# Patient Record
Sex: Female | Born: 1995 | Race: White | Hispanic: No | Marital: Single
Health system: Southern US, Community
[De-identification: ages and names within clinical notes are randomized; demographics above are authoritative.]

## PROBLEM LIST (undated history)

## (undated) DIAGNOSIS — F329 Major depressive disorder, single episode, unspecified: Secondary | ICD-10-CM

## (undated) DIAGNOSIS — K219 Gastro-esophageal reflux disease without esophagitis: Secondary | ICD-10-CM

## (undated) DIAGNOSIS — M5126 Other intervertebral disc displacement, lumbar region: Secondary | ICD-10-CM

## (undated) DIAGNOSIS — J45909 Unspecified asthma, uncomplicated: Secondary | ICD-10-CM

## (undated) DIAGNOSIS — F32A Depression, unspecified: Secondary | ICD-10-CM

## (undated) HISTORY — PX: ADENOIDECTOMY: SUR15

## (undated) HISTORY — PX: TONSILLECTOMY: SUR1361

## (undated) HISTORY — PX: TUMOR EXCISION: SHX421

## (undated) HISTORY — DX: Gastro-esophageal reflux disease without esophagitis: K21.9

---
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
## 2015-03-07 NOTE — Therapy (Signed)
[facility] [place], [place], [address] Phone: [phone]   Fax:  [phone]  Physical Therapy Treatment  Patient Details  Name: Melanie Mcdaniel MRN: [id number] Date of Birth: 09/21/1995 No Data Recorded  Encounter Date: 11/27/2014    Past Medical History  Diagnosis Date  . GERD (gastroesophageal reflux disease)   . Lumbar herniated disc   . Depression   . Asthma     Past Surgical History  Procedure Laterality Date  . Tonsillectomy    . 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 Adenoidectomy    . Lumbar laminectomy Right 04/23/2014    Procedure: Right L5-S1 Microdiscectomy;  Surgeon: Marybelle Killings, MD;  Location: [facility];  Service: Orthopedics;  Laterality: Right;    There were no vitals filed for this visit.  Visit Diagnosis:  Right low back pain, with sciatica presence unspecified  Lower extremity pain, right  Debility                                    PT Long Term Goals - 11/27/14 1639    PT LONG TERM GOAL #1   Title Ind with HEP.   Time 6   Period Weeks   Status Achieved   PT LONG TERM GOAL #2   Title Sit 30 minutes with pain not > 3/10.   Time 6   Period Weeks   Status Not Met   PT LONG TERM GOAL #3   Title Stand 30 minutes with pain not > 3/10.   Baseline Pain rises to nearly 10/10 after prolonged sitting.   Time 6   Period Weeks   Status Not Met   PT LONG TERM GOAL #4   Title Eliminate right LE pain.   Baseline Patient has (at times) intense right LE pain.   Period Weeks   Status Not Met               Problem List Patient Active Problem List   Diagnosis Date Noted  . S/P lumbar discectomy 04/23/2014   PHYSICAL THERAPY DISCHARGE SUMMARY  Visits from Start of Care: 8  Current functional level related to goals / functional outcomes: Please see above.   Remaining deficits: Continued low back pain.   Education / Equipment: HEP.   Plan: Patient agrees to discharge.  Patient goals were not met. Patient is being discharged due to lack of progress.  ?????      Kyel Purk, [place] MPT 03/07/2015, 2:59 PM  [facility] [address] [place], [place], [address] Phone: [phone]   Fax:  [phone]  Name: Melanie Mcdaniel MRN: [id number] Date of Birth: 11-Jun-1995

## 2015-05-04 IMAGING — CR DG CHEST 2V
2 series · 2 of 2 positions shown · non-contrast
Comparison: None.

CLINICAL DATA: Preoperative evaluation for lumbar surgery

EXAM:
CHEST  2 VIEW

[w chest pa]
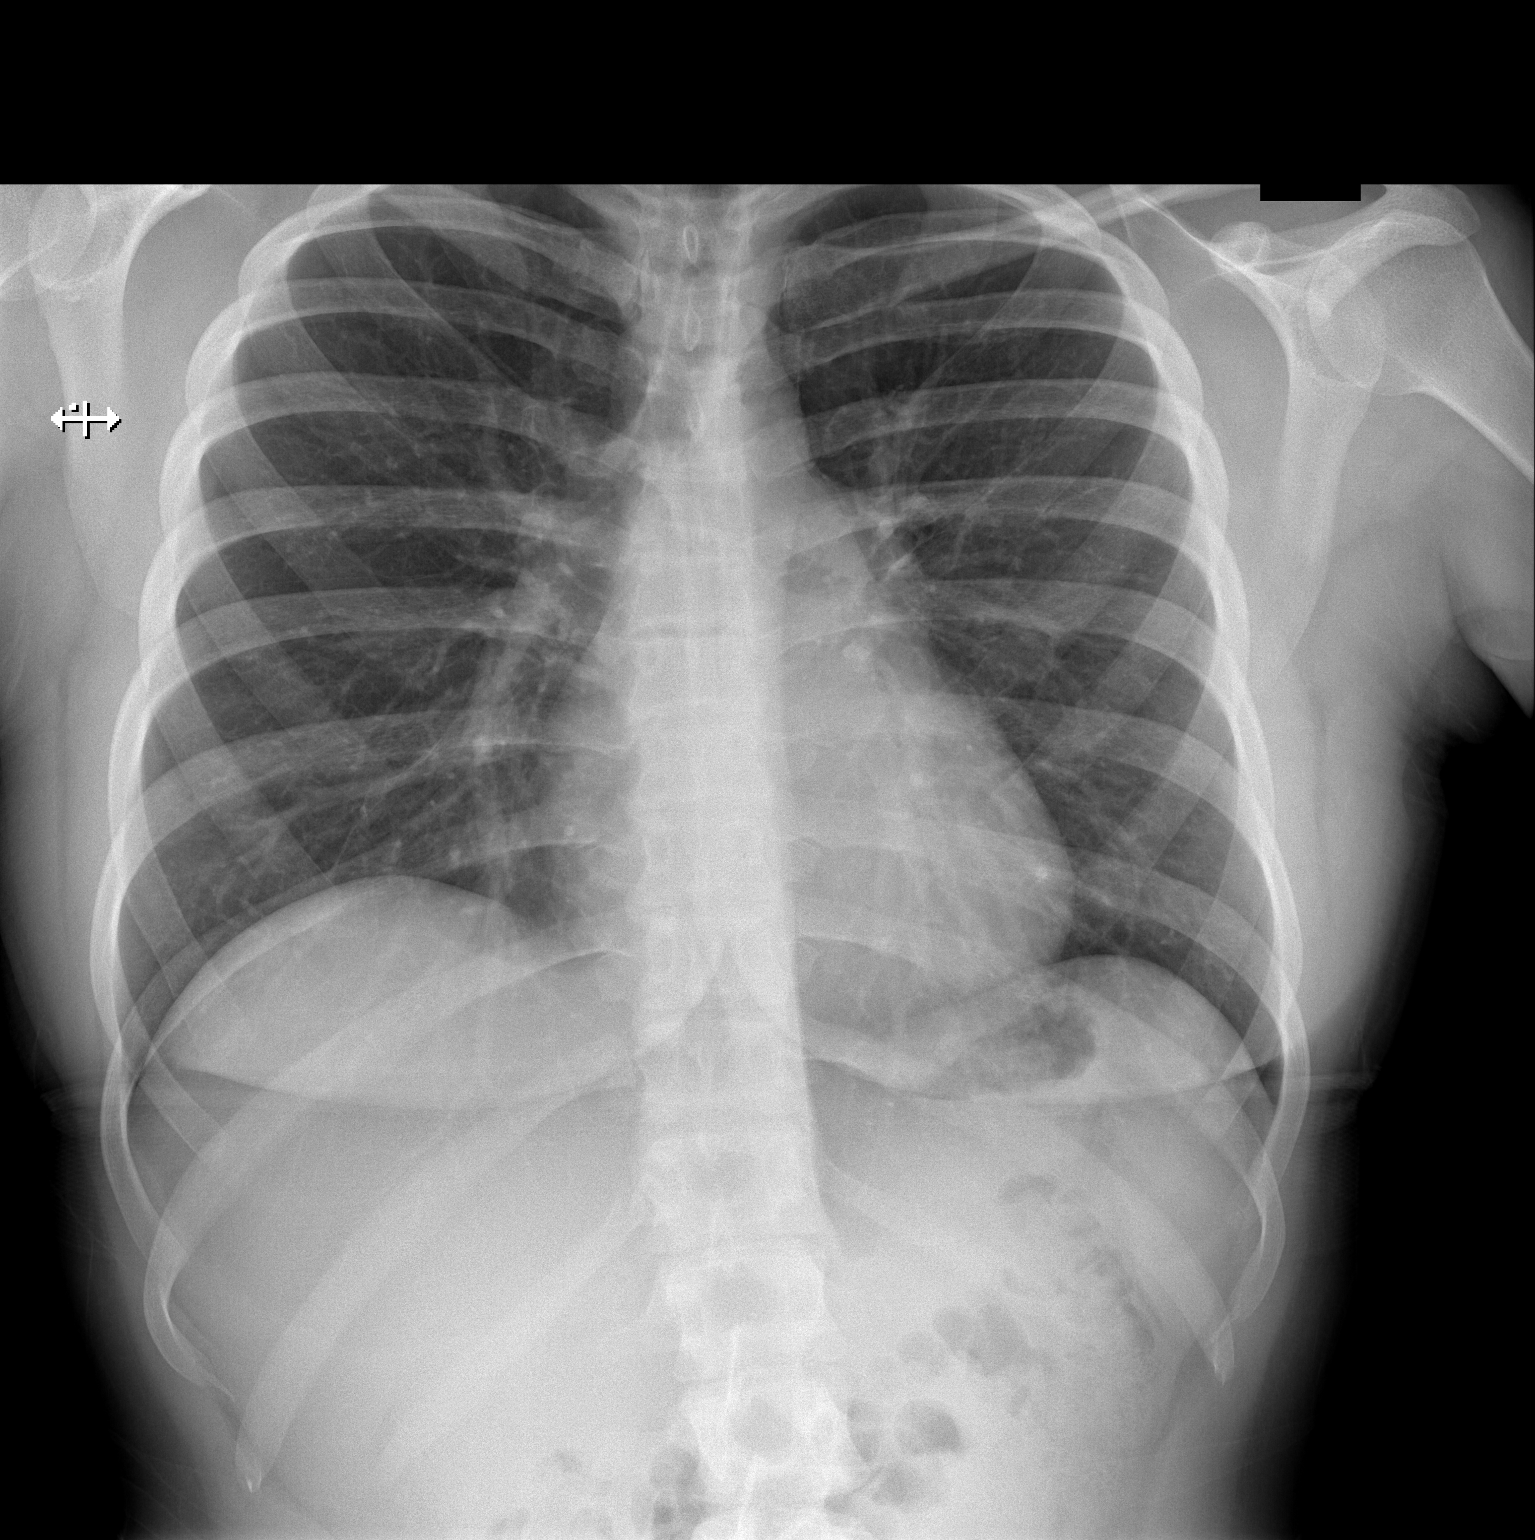

[w chest lat]
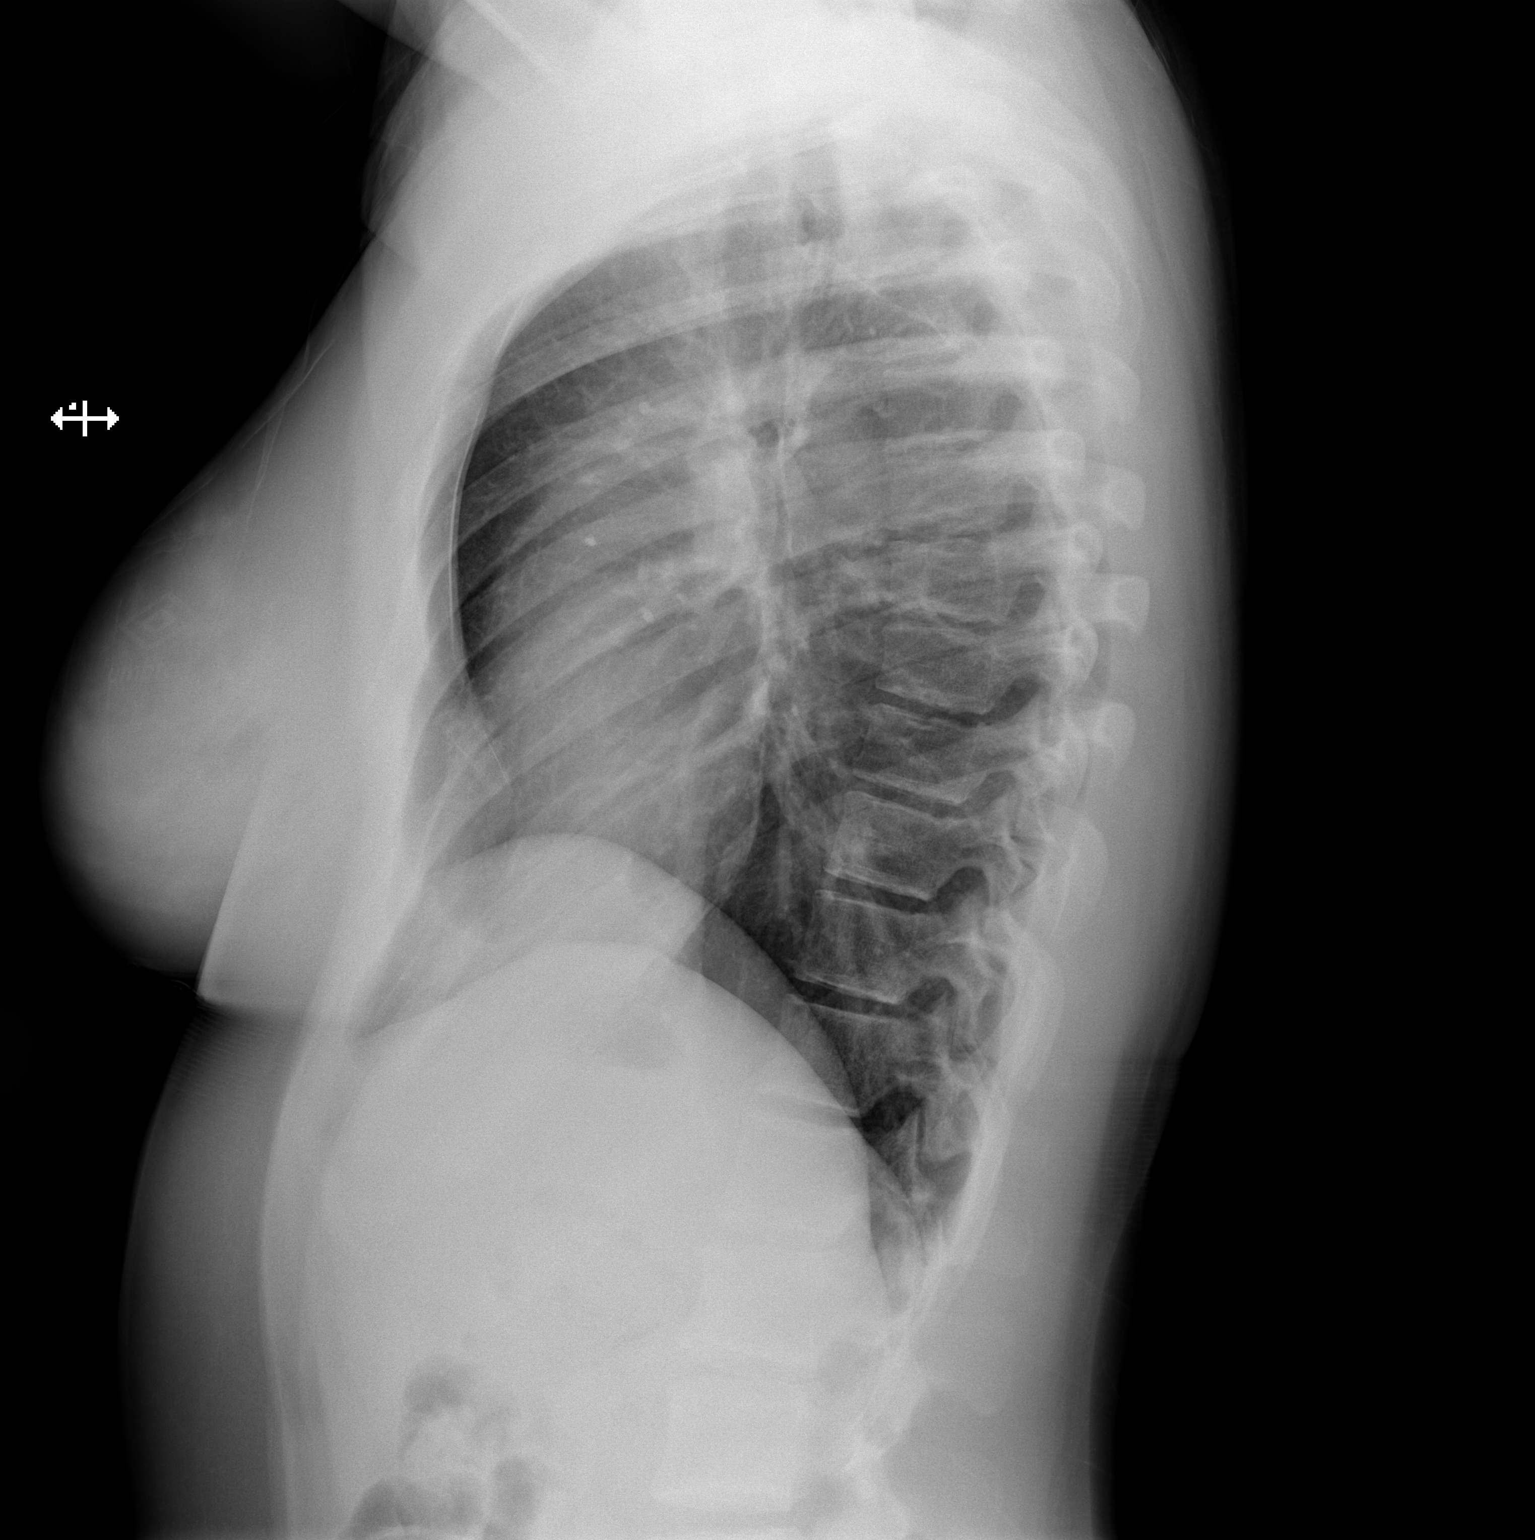

[2 of 2 positions shown; findings below may reference images not displayed]

FINDINGS: The heart size and mediastinal contours are within normal limits.
Both lungs are clear. The visualized skeletal structures are
unremarkable.
IMPRESSION: No active cardiopulmonary disease.

## 2016-01-23 ENCOUNTER — Ambulatory Visit (INDEPENDENT_AMBULATORY_CARE_PROVIDER_SITE_OTHER): Payer: Medicaid Other | Admitting: Orthopaedic Surgery

## 2016-03-16 ENCOUNTER — Other Ambulatory Visit: Payer: Self-pay | Admitting: Physician Assistant

## 2016-03-16 MED ORDER — ALPRAZOLAM 1 MG PO TABS: 1.0000 mg | ORAL_TABLET | Freq: Two times a day (BID) | ORAL | 0 refills | Status: DC | PRN

## 2016-03-16 NOTE — Progress Notes (Signed)
Rx called in to pharmacy. 

## 2016-03-24 ENCOUNTER — Other Ambulatory Visit: Payer: Self-pay | Admitting: *Deleted

## 2016-05-01 ENCOUNTER — Other Ambulatory Visit: Payer: Self-pay | Admitting: Physician Assistant

## 2016-06-09 ENCOUNTER — Telehealth (INDEPENDENT_AMBULATORY_CARE_PROVIDER_SITE_OTHER): Payer: Self-pay | Admitting: Orthopaedic Surgery

## 2016-06-09 NOTE — Telephone Encounter (Signed)
Patient called asked if Dr Ophelia CharterYates would write her a note for missing work on Saturday afternoon. Patient said her back is really hurting her. The number to contact patient is [phone]

## 2016-06-09 NOTE — Telephone Encounter (Signed)
Note entered and at front for pick up. I left voicemail for patient advising. Also asked her to call and make return appt if she continues to have pain.

## 2016-06-09 NOTE — Telephone Encounter (Signed)
Ok to do ,  ROV if  Keeps bothering her. thx

## 2016-06-09 NOTE — Telephone Encounter (Signed)
Please advise 

## 2016-06-12 ENCOUNTER — Telehealth (INDEPENDENT_AMBULATORY_CARE_PROVIDER_SITE_OTHER): Payer: Self-pay | Admitting: Orthopaedic Surgery

## 2016-06-12 NOTE — Telephone Encounter (Signed)
Patient called asked if her work note can be faxed [facility] to [facility] for her to pick up after her appointment on 06/18/16. The number to contact patient is [phone]

## 2016-06-12 NOTE — Telephone Encounter (Signed)
Will print work note in [place] [facility] [facility] when there for appt

## 2016-06-18 ENCOUNTER — Ambulatory Visit (INDEPENDENT_AMBULATORY_CARE_PROVIDER_SITE_OTHER): Payer: Self-pay | Admitting: Orthopaedic Surgery

## 2017-02-19 ENCOUNTER — Encounter: Payer: Self-pay | Admitting: Physician Assistant

## 2017-02-19 ENCOUNTER — Ambulatory Visit: Payer: BLUE CROSS/BLUE SHIELD | Admitting: Physician Assistant

## 2017-02-19 VITALS — BP 122/85 | HR 66 | Temp 97.3°F | Ht 67.5 in | Wt 177.0 lb

## 2017-02-19 DIAGNOSIS — L659 Nonscarring hair loss, unspecified: Secondary | ICD-10-CM

## 2017-02-19 DIAGNOSIS — Z01419 Encounter for gynecological examination (general) (routine) without abnormal findings: Secondary | ICD-10-CM | POA: Insufficient documentation

## 2017-02-19 DIAGNOSIS — N946 Dysmenorrhea, unspecified: Secondary | ICD-10-CM

## 2017-02-19 DIAGNOSIS — R635 Abnormal weight gain: Secondary | ICD-10-CM | POA: Diagnosis not present

## 2017-02-19 DIAGNOSIS — M5136 Other intervertebral disc degeneration, lumbar region: Secondary | ICD-10-CM | POA: Diagnosis not present

## 2017-02-19 DIAGNOSIS — Z309 Encounter for contraceptive management, unspecified: Secondary | ICD-10-CM

## 2017-02-19 MED ORDER — NORGESTIMATE-ETH ESTRADIOL 0.25-35 MG-MCG PO TABS: 1.0000 | ORAL_TABLET | Freq: Every day | ORAL | 11 refills | Status: DC

## 2017-02-19 MED ORDER — DICLOFENAC SODIUM 75 MG PO TBEC: 75.0000 mg | DELAYED_RELEASE_TABLET | Freq: Two times a day (BID) | ORAL | 5 refills | Status: DC

## 2017-02-19 NOTE — Progress Notes (Signed)
BP 122/85   Pulse 66   Temp (!) 97.3 F (36.3 C) (Oral)   Ht 5' 7.5" (1.715 m)   Wt 177 lb (80.3 kg)   BMI 27.31 kg/m    Subjective:    Patient ID: Melanie Mcdaniel, female    DOB: November 03, 1995, 22 y.o.   MRN: [id number]  HPI: Melanie Mcdaniel is a 22 y.o. female presenting on 02/19/2017 for New Patient (Initial Visit) and Establish Care  Patient comes in to be established as a new patient.  In the past she has had a herniated disc that has had a surgery.  He continues with chronic pain associated with degenerative disc disease.  He is also having very painful and heavy periods.  At age 69 she started taking birth control pill because of her heavy cycles.  Due to insurance she has not been on medication for a couple years now.  Would like to get back on it.  She is sexually active at this time.  She does use protection.  She has been with her boyfriend for a year now.  She reports also change in her hair.  She said a lot has fallen off and broken out.  She has a hard time growing about.  She is also had a change in her weight.  Relevant past medical, surgical, family and social history reviewed and updated as indicated. Allergies and medications reviewed and updated.  Past Medical History:  Diagnosis Date  . Asthma   . Depression   . GERD (gastroesophageal reflux disease)   . Lumbar herniated disc     Past Surgical History:  Procedure Laterality Date  . ADENOIDECTOMY    . LUMBAR LAMINECTOMY Right 04/23/2014   Procedure: Right L5-S1 Microdiscectomy;  Surgeon: Marybelle Killings, MD;  Location: [facility];  Service: Orthopedics;  Laterality: Right;  . TONSILLECTOMY    . TUMOR EXCISION Right age 6   around carotid artery    Review of Systems  Constitutional: Negative.  Negative for activity change, fatigue and fever.  HENT: Negative.   Eyes: Negative.   Respiratory: Negative.  Negative for cough.   Cardiovascular: Negative.  Negative for chest pain.  Gastrointestinal: Negative.  Negative for  abdominal pain.  Endocrine: Negative.   Genitourinary: Positive for menstrual problem and pelvic pain. Negative for dysuria.  Musculoskeletal: Positive for back pain and myalgias. Negative for gait problem.  Skin: Negative.   Neurological: Negative.     Allergies as of 02/19/2017      Reactions   Tamiflu [oseltamivir Phosphate] Anaphylaxis   Swelling of throat, hands and itching, couldn't breathe      Medication List        Accurate as of 02/19/17  1:29 PM. Always use your most recent med list.          diclofenac 75 MG EC tablet Commonly known as:  VOLTAREN Take 1 tablet (75 mg total) by mouth 2 (two) times daily.   norgestimate-ethinyl estradiol 0.25-35 MG-MCG tablet Commonly known as:  ORTHO-CYCLEN,SPRINTEC,PREVIFEM Take 1 tablet by mouth daily.          Objective:    BP 122/85   Pulse 66   Temp (!) 97.3 F (36.3 C) (Oral)   Ht 5' 7.5" (1.715 m)   Wt 177 lb (80.3 kg)   BMI 27.31 kg/m   Allergies  Allergen Reactions  . Tamiflu [Oseltamivir Phosphate] Anaphylaxis    Swelling of throat, hands and itching, couldn't breathe    Physical Exam  Constitutional: She is oriented to person, place, and time. She appears well-developed and well-nourished.  HENT:  Head: Normocephalic and atraumatic.  Right Ear: Tympanic membrane, external ear and ear canal normal.  Left Ear: Tympanic membrane, external ear and ear canal normal.  Nose: Nose normal. No rhinorrhea.  Mouth/Throat: Oropharynx is clear and moist and mucous membranes are normal. No oropharyngeal exudate or posterior oropharyngeal erythema.  Eyes: Conjunctivae and EOM are normal. Pupils are equal, round, and reactive to light.  Neck: Normal range of motion. Neck supple.  Cardiovascular: Normal rate, regular rhythm, normal heart sounds and intact distal pulses.  Pulmonary/Chest: Effort normal and breath sounds normal.  Abdominal: Soft. Bowel sounds are normal.  Neurological: She is alert and oriented to  person, place, and time. She has normal reflexes.  Skin: Skin is warm and dry. No rash noted.  Psychiatric: She has a normal mood and affect. Her behavior is normal. Judgment and thought content normal.  Nursing note and vitals reviewed.   Results for orders placed or performed during the hospital encounter of 04/13/14  Surgical pcr screen  Result Value Ref Range   MRSA, PCR NEGATIVE NEGATIVE   Staphylococcus aureus NEGATIVE NEGATIVE  hCG, serum, qualitative  Result Value Ref Range   Preg, Serum NEGATIVE NEGATIVE  CBC  Result Value Ref Range   WBC 6.8 4.0 - 10.5 K/uL   RBC 4.67 3.87 - 5.11 MIL/uL   Hemoglobin 13.2 12.0 - 15.0 g/dL   HCT 39.6 36.0 - 46.0 %   MCV 84.8 78.0 - 100.0 fL   MCH 28.3 26.0 - 34.0 pg   MCHC 33.3 30.0 - 36.0 g/dL   RDW 13.9 11.5 - 15.5 %   Platelets 248 150 - 400 K/uL  Comprehensive metabolic panel  Result Value Ref Range   Sodium 138 135 - 145 mmol/L   Potassium 3.9 3.5 - 5.1 mmol/L   Chloride 103 96 - 112 mmol/L   CO2 28 19 - 32 mmol/L   Glucose, Bld 87 70 - 99 mg/dL   BUN 8 6 - 23 mg/dL   Creatinine, Ser 0.88 0.50 - 1.10 mg/dL   Calcium 9.6 8.4 - 10.5 mg/dL   Total Protein 7.3 6.0 - 8.3 g/dL   Albumin 4.0 3.5 - 5.2 g/dL   AST 22 0 - 37 U/L   ALT 15 0 - 35 U/L   Alkaline Phosphatase 54 39 - 117 U/L   Total Bilirubin 0.6 0.3 - 1.2 mg/dL   GFR calc non Af Amer >90 >90 mL/min   GFR calc Af Amer >90 >90 mL/min   Anion gap 7 5 - 15      Assessment & Plan:   1. DDD (degenerative disc disease), lumbar - diclofenac (VOLTAREN) 75 MG EC tablet; Take 1 tablet (75 mg total) by mouth 2 (two) times daily.  Dispense: 60 tablet; Refill: 5  2. Dysmenorrhea  3. Encounter for contraceptive management, unspecified type - norgestimate-ethinyl estradiol (ORTHO-CYCLEN,SPRINTEC,PREVIFEM) 0.25-35 MG-MCG tablet; Take 1 tablet by mouth daily.  Dispense: 1 Package; Refill: 11  4. Weight gain - CBC with Differential/Platelet - CMP14+EGFR - Thyroid Panel With  TSH  5. Hair loss - CBC with Differential/Platelet - CMP14+EGFR - Thyroid Panel With TSH    Current Outpatient Medications:  .  diclofenac (VOLTAREN) 75 MG EC tablet, Take 1 tablet (75 mg total) by mouth 2 (two) times daily., Disp: 60 tablet, Rfl: 5 .  norgestimate-ethinyl estradiol (ORTHO-CYCLEN,SPRINTEC,PREVIFEM) 0.25-35 MG-MCG tablet, Take 1 tablet by mouth daily.,  Disp: 1 Package, Rfl: 11 Continue all other maintenance medications as listed above.  Follow up plan: Return in about 6 months (around 08/19/2017).  Educational handout given for Wheelwright PA-C [facility] [address]  [place], [place] [address] [phone]   02/19/2017, 1:29 PM

## 2017-02-19 NOTE — Patient Instructions (Signed)
In a few days you may receive a survey in the mail or online from [employer] regarding your visit with us today. Please take a moment to fill this out. Your feedback is very important to our whole office. It can help us better understand your needs as well as improve your experience and satisfaction. Thank you for taking your time to complete it. We care about you.  Simi Briel, PA-C  

## 2017-02-20 LAB — CMP14+EGFR
A/G RATIO: 2 (ref 1.2–2.2)
ALT: 23 IU/L (ref 0–32)
AST: 25 IU/L (ref 0–40)
Albumin: 4.5 g/dL (ref 3.5–5.5)
Alkaline Phosphatase: 77 IU/L (ref 39–117)
BUN / CREAT RATIO: 14 (ref 9–23)
BUN: 10 mg/dL (ref 6–20)
Bilirubin Total: <0.2 mg/dL (ref 0.0–1.2)
CALCIUM: 9.5 mg/dL (ref 8.7–10.2)
CO2: 22 mmol/L (ref 20–29)
Chloride: 102 mmol/L (ref 96–106)
Creatinine, Ser: 0.69 mg/dL (ref 0.57–1.00)
GFR, EST AFRICAN AMERICAN: 144 mL/min/{1.73_m2} (ref >59)
GFR, EST NON AFRICAN AMERICAN: 125 mL/min/{1.73_m2} (ref >59)
Globulin, Total: 2.3 g/dL (ref 1.5–4.5)
Glucose: 91 mg/dL (ref 65–99)
Potassium: 4.4 mmol/L (ref 3.5–5.2)
Sodium: 139 mmol/L (ref 134–144)
TOTAL PROTEIN: 6.8 g/dL (ref 6.0–8.5)

## 2017-02-20 LAB — THYROID PANEL WITH TSH
Free Thyroxine Index: 1.3 (ref 1.2–4.9)
T3 Uptake Ratio: 26 % (ref 24–39)
T4, Total: 5.1 ug/dL (ref 4.5–12.0)
TSH: 1.42 u[IU]/mL (ref 0.450–4.500)

## 2017-02-20 LAB — CBC WITH DIFFERENTIAL/PLATELET
Basophils Absolute: 0 10*3/uL (ref 0.0–0.2)
Basos: 0 %
EOS (ABSOLUTE): 0.1 10*3/uL (ref 0.0–0.4)
Eos: 1 %
HEMATOCRIT: 42 % (ref 34.0–46.6)
Hemoglobin: 14 g/dL (ref 11.1–15.9)
IMMATURE GRANS (ABS): 0 10*3/uL (ref 0.0–0.1)
IMMATURE GRANULOCYTES: 0 %
Lymphocytes Absolute: 1.6 10*3/uL (ref 0.7–3.1)
Lymphs: 26 %
MCH: 29.9 pg (ref 26.6–33.0)
MCHC: 33.3 g/dL (ref 31.5–35.7)
MCV: 90 fL (ref 79–97)
MONOS ABS: 0.4 10*3/uL (ref 0.1–0.9)
Monocytes: 6 %
NEUTROS ABS: 4.2 10*3/uL (ref 1.4–7.0)
NEUTROS PCT: 67 %
Platelets: 218 10*3/uL (ref 150–379)
RBC: 4.69 x10E6/uL (ref 3.77–5.28)
RDW: 15.6 % — AB (ref 12.3–15.4)
WBC: 6.3 10*3/uL (ref 3.4–10.8)

## 2017-06-23 ENCOUNTER — Encounter: Payer: Self-pay | Admitting: Physician Assistant

## 2017-06-23 ENCOUNTER — Ambulatory Visit: Payer: BLUE CROSS/BLUE SHIELD | Admitting: Physician Assistant

## 2017-06-23 VITALS — BP 130/94 | HR 77 | Temp 98.2°F | Ht 67.5 in | Wt 167.8 lb

## 2017-06-23 DIAGNOSIS — M5431 Sciatica, right side: Secondary | ICD-10-CM | POA: Diagnosis not present

## 2017-06-23 DIAGNOSIS — M5136 Other intervertebral disc degeneration, lumbar region: Secondary | ICD-10-CM

## 2017-06-23 DIAGNOSIS — Z3041 Encounter for surveillance of contraceptive pills: Secondary | ICD-10-CM | POA: Diagnosis not present

## 2017-06-23 MED ORDER — GABAPENTIN 400 MG PO CAPS: 400.0000 mg | ORAL_CAPSULE | Freq: Three times a day (TID) | ORAL | 5 refills | Status: DC

## 2017-06-23 MED ORDER — NORETHIN ACE-ETH ESTRAD-FE 1-20 MG-MCG PO TABS: 1.0000 | ORAL_TABLET | Freq: Every day | ORAL | 12 refills | Status: DC

## 2017-06-23 MED ORDER — PREDNISONE 10 MG (21) PO TBPK
ORAL_TABLET | ORAL | 0 refills | Status: DC
Start: 2017-06-23 — End: 2017-07-15

## 2017-06-27 NOTE — Progress Notes (Signed)
BP (!) 130/94   Pulse 77   Temp 98.2 F (36.8 C) (Oral)   Ht 5' 7.5" (1.715 m)   Wt 167 lb 12.8 oz (76.1 kg)   LMP 05/23/2017   BMI 25.89 kg/m    Subjective:    Patient ID: Melanie Mcdaniel, female    DOB: 16-Nov-1995, 22 y.o.   MRN: [id number]  HPI: Melanie FillersChloe Lumm is a 22 y.o. female presenting on 06/23/2017 for discuss birth control  This patient comes in for an appointment to discuss birth control.  She has been taking the same birth control for some time.  She is beginning to have more severe and breakthrough cycles.  She still has a monthly.  That will be heavy and cramping sometimes.  In addition she is having a flareup of sciatica in her right leg.  She had surgery on this area many years ago for a ruptured disc.  It does not bother her all the time but at times after she Has worked all day she will have a flareup.  Past Medical History:  Diagnosis Date  . Asthma   . Depression   . GERD (gastroesophageal reflux disease)   . Lumbar herniated disc    Relevant past medical, surgical, family and social history reviewed and updated as indicated. Interim medical history since our last visit reviewed. Allergies and medications reviewed and updated. DATA REVIEWED: CHART IN EPIC  Family History reviewed for pertinent findings.  Review of Systems  Constitutional: Negative.  Negative for activity change, fatigue and fever.  HENT: Negative.   Eyes: Negative.   Respiratory: Negative.  Negative for cough.   Cardiovascular: Negative.  Negative for chest pain.  Gastrointestinal: Negative.  Negative for abdominal pain.  Endocrine: Negative.   Genitourinary: Positive for menstrual problem, pelvic pain and vaginal bleeding. Negative for dysuria and vaginal pain.  Musculoskeletal: Negative.   Skin: Negative.   Neurological: Negative.     Allergies as of 06/23/2017      Reactions   Tamiflu [oseltamivir Phosphate] Anaphylaxis   Swelling of throat, hands and itching, couldn't breathe      Medication List        Accurate as of 06/23/17 11:59 PM. Always use your most recent med list.          gabapentin 400 MG capsule Commonly known as:  NEURONTIN Take 1 capsule (400 mg total) by mouth 3 (three) times daily.   norethindrone-ethinyl estradiol 1-20 MG-MCG tablet Commonly known as:  JUNEL FE,GILDESS FE,LOESTRIN FE Take 1 tablet by mouth daily.   predniSONE 10 MG (21) Tbpk tablet Commonly known as:  STERAPRED UNI-PAK 21 TAB As directed x 6 days          Objective:    BP (!) 130/94   Pulse 77   Temp 98.2 F (36.8 C) (Oral)   Ht 5' 7.5" (1.715 m)   Wt 167 lb 12.8 oz (76.1 kg)   LMP 05/23/2017   BMI 25.89 kg/m   Allergies  Allergen Reactions  . Tamiflu [Oseltamivir Phosphate] Anaphylaxis    Swelling of throat, hands and itching, couldn't breathe    Wt Readings from Last 3 Encounters:  06/23/17 167 lb 12.8 oz (76.1 kg)  02/19/17 177 lb (80.3 kg)  04/23/14 172 lb (78 kg) (93 %, Z= 1.47)*   * Growth percentiles are based on CDC (Girls, 2-20 Years) data.    Physical Exam  Constitutional: She is oriented to person, place, and time. She appears well-developed and  well-nourished.  HENT:  Head: Normocephalic and atraumatic.  Eyes: Pupils are equal, round, and reactive to light. Conjunctivae and EOM are normal.  Cardiovascular: Normal rate, regular rhythm, normal heart sounds and intact distal pulses.  Pulmonary/Chest: Effort normal and breath sounds normal.  Abdominal: Soft. Bowel sounds are normal.  Musculoskeletal:       Lumbar back: She exhibits decreased range of motion, tenderness, pain and spasm.  Neurological: She is alert and oriented to person, place, and time. She has normal reflexes.  Skin: Skin is warm and dry. No rash noted.  Psychiatric: She has a normal mood and affect. Her behavior is normal. Judgment and thought content normal.        Assessment & Plan:   1. DDD (degenerative disc disease), lumbar - predniSONE (STERAPRED UNI-PAK  21 TAB) 10 MG (21) TBPK tablet; As directed x 6 days  Dispense: 21 tablet; Refill: 0 - gabapentin (NEURONTIN) 400 MG capsule; Take 1 capsule (400 mg total) by mouth 3 (three) times daily.  Dispense: 90 capsule; Refill: 5  2. Encounter for surveillance of contraceptive pills - norethindrone-ethinyl estradiol (JUNEL FE,GILDESS FE,LOESTRIN FE) 1-20 MG-MCG tablet; Take 1 tablet by mouth daily.  Dispense: 1 Package; Refill: 12  3. Sciatica of right side - predniSONE (STERAPRED UNI-PAK 21 TAB) 10 MG (21) TBPK tablet; As directed x 6 days  Dispense: 21 tablet; Refill: 0 - gabapentin (NEURONTIN) 400 MG capsule; Take 1 capsule (400 mg total) by mouth 3 (three) times daily.  Dispense: 90 capsule; Refill: 5    Continue all other maintenance medications as listed above.  Follow up plan: No follow-ups on file.  Educational handout given for survey  Remus Loffler PA-C [facility] [address]  [place], [place] [address] [phone]   06/27/2017, 9:10 PM

## 2017-07-15 ENCOUNTER — Encounter: Payer: Self-pay | Admitting: Physician Assistant

## 2017-07-15 ENCOUNTER — Ambulatory Visit: Payer: BLUE CROSS/BLUE SHIELD | Admitting: Physician Assistant

## 2017-07-15 VITALS — BP 139/78 | HR 90 | Temp 98.7°F | Ht 67.5 in | Wt 170.2 lb

## 2017-07-15 DIAGNOSIS — J209 Acute bronchitis, unspecified: Secondary | ICD-10-CM | POA: Diagnosis not present

## 2017-07-15 MED ORDER — AZITHROMYCIN 250 MG PO TABS
ORAL_TABLET | ORAL | 0 refills | Status: DC
Start: 2017-07-15 — End: 2017-08-31

## 2017-07-15 MED ORDER — BENZONATATE 200 MG PO CAPS: 200.0000 mg | ORAL_CAPSULE | Freq: Two times a day (BID) | ORAL | 0 refills | Status: DC | PRN

## 2017-07-15 MED ORDER — PREDNISONE 10 MG (21) PO TBPK: ORAL_TABLET | ORAL | 0 refills | Status: DC

## 2017-07-16 ENCOUNTER — Telehealth: Payer: Self-pay | Admitting: Physician Assistant

## 2017-07-16 ENCOUNTER — Other Ambulatory Visit: Payer: Self-pay | Admitting: Physician Assistant

## 2017-07-16 MED ORDER — HYDROCODONE-HOMATROPINE 5-1.5 MG/5ML PO SYRP: 5.0000 mL | ORAL_SOLUTION | Freq: Four times a day (QID) | ORAL | 0 refills | Status: DC | PRN

## 2017-07-16 MED ORDER — ALBUTEROL SULFATE HFA 108 (90 BASE) MCG/ACT IN AERS: 2.0000 | INHALATION_SPRAY | Freq: Four times a day (QID) | RESPIRATORY_TRACT | 0 refills | Status: DC | PRN

## 2017-07-16 NOTE — Telephone Encounter (Signed)
Please advise 

## 2017-07-16 NOTE — Telephone Encounter (Signed)
sent 

## 2017-07-16 NOTE — Progress Notes (Signed)
BP 139/78   Pulse 90   Temp 98.7 F (37.1 C) (Oral)   Ht 5' 7.5" (1.715 m)   Wt 170 lb 3.2 oz (77.2 kg)   BMI 26.26 kg/m    Subjective:    Patient ID: Melanie Mcdaniel, female    DOB: 1995/02/21, 22 y.o.   MRN: [id number]  HPI: Melanie Mcdaniel is a 22 y.o. female presenting on 07/15/2017 for Cough and Nasal Congestion  Patient with several days of progressing upper respiratory and bronchial symptoms. Initially there was more upper respiratory congestion. This progressed to having significant cough that is productive throughout the day and severe at night. There is occasional wheezing after coughing. Sometimes there is slight dyspnea on exertion. It is productive mucus that is yellow in color. Denies any blood.   Past Medical History:  Diagnosis Date  . Asthma   . Depression   . GERD (gastroesophageal reflux disease)   . Lumbar herniated disc    Relevant past medical, surgical, family and social history reviewed and updated as indicated. Interim medical history since our last visit reviewed. Allergies and medications reviewed and updated. DATA REVIEWED: CHART IN EPIC  Family History reviewed for pertinent findings.  Review of Systems  Constitutional: Positive for chills and fatigue. Negative for activity change and appetite change.  HENT: Positive for congestion, postnasal drip and sore throat.   Eyes: Negative.   Respiratory: Positive for cough and wheezing.   Cardiovascular: Negative.  Negative for chest pain, palpitations and leg swelling.  Gastrointestinal: Negative.   Genitourinary: Negative.   Musculoskeletal: Negative.   Skin: Negative.   Neurological: Positive for headaches.    Allergies as of 07/15/2017      Reactions   Tamiflu [oseltamivir Phosphate] Anaphylaxis   Swelling of throat, hands and itching, couldn't breathe      Medication List        Accurate as of 07/15/17 11:59 PM. Always use your most recent med list.          azithromycin 250 MG  tablet Commonly known as:  ZITHROMAX As directed   benzonatate 200 MG capsule Commonly known as:  TESSALON Take 1 capsule (200 mg total) by mouth 2 (two) times daily as needed for cough.   gabapentin 400 MG capsule Commonly known as:  NEURONTIN Take 1 capsule (400 mg total) by mouth 3 (three) times daily.   norethindrone-ethinyl estradiol 1-20 MG-MCG tablet Commonly known as:  JUNEL FE,GILDESS FE,LOESTRIN FE Take 1 tablet by mouth daily.   predniSONE 10 MG (21) Tbpk tablet Commonly known as:  STERAPRED UNI-PAK 21 TAB Take as directed          Objective:    BP 139/78   Pulse 90   Temp 98.7 F (37.1 C) (Oral)   Ht 5' 7.5" (1.715 m)   Wt 170 lb 3.2 oz (77.2 kg)   BMI 26.26 kg/m   Allergies  Allergen Reactions  . Tamiflu [Oseltamivir Phosphate] Anaphylaxis    Swelling of throat, hands and itching, couldn't breathe    Wt Readings from Last 3 Encounters:  07/15/17 170 lb 3.2 oz (77.2 kg)  06/23/17 167 lb 12.8 oz (76.1 kg)  02/19/17 177 lb (80.3 kg)    Physical Exam  Constitutional: She is oriented to person, place, and time. She appears well-developed and well-nourished.  HENT:  Head: Normocephalic and atraumatic.  Right Ear: There is drainage and tenderness.  Left Ear: There is drainage and tenderness.  Nose: Mucosal edema and rhinorrhea present. Right  sinus exhibits no maxillary sinus tenderness and no frontal sinus tenderness. Left sinus exhibits no maxillary sinus tenderness and no frontal sinus tenderness.  Mouth/Throat: Oropharyngeal exudate and posterior oropharyngeal erythema present.  Eyes: Pupils are equal, round, and reactive to light. Conjunctivae and EOM are normal.  Neck: Normal range of motion. Neck supple.  Cardiovascular: Normal rate, regular rhythm, normal heart sounds and intact distal pulses.  Pulmonary/Chest: Effort normal. She has wheezes in the right upper field and the left upper field.  Abdominal: Soft. Bowel sounds are normal.   Neurological: She is alert and oriented to person, place, and time. She has normal reflexes.  Skin: Skin is warm and dry. No rash noted.  Psychiatric: She has a normal mood and affect. Her behavior is normal. Judgment and thought content normal.    Results for orders placed or performed in visit on 02/19/17  CBC with Differential/Platelet  Result Value Ref Range   WBC 6.3 3.4 - 10.8 x10E3/uL   RBC 4.69 3.77 - 5.28 x10E6/uL   Hemoglobin 14.0 11.1 - 15.9 g/dL   Hematocrit 42.0 34.0 - 46.6 %   MCV 90 79 - 97 fL   MCH 29.9 26.6 - 33.0 pg   MCHC 33.3 31.5 - 35.7 g/dL   RDW 15.6 (H) 12.3 - 15.4 %   Platelets 218 150 - 379 x10E3/uL   Neutrophils 67 Not Estab. %   Lymphs 26 Not Estab. %   Monocytes 6 Not Estab. %   Eos 1 Not Estab. %   Basos 0 Not Estab. %   Neutrophils Absolute 4.2 1.4 - 7.0 x10E3/uL   Lymphocytes Absolute 1.6 0.7 - 3.1 x10E3/uL   Monocytes Absolute 0.4 0.1 - 0.9 x10E3/uL   EOS (ABSOLUTE) 0.1 0.0 - 0.4 x10E3/uL   Basophils Absolute 0.0 0.0 - 0.2 x10E3/uL   Immature Granulocytes 0 Not Estab. %   Immature Grans (Abs) 0.0 0.0 - 0.1 x10E3/uL  CMP14+EGFR  Result Value Ref Range   Glucose 91 65 - 99 mg/dL   BUN 10 6 - 20 mg/dL   Creatinine, Ser 0.69 0.57 - 1.00 mg/dL   GFR calc non Af Amer 125 >59 mL/min/1.73   GFR calc Af Amer 144 >59 mL/min/1.73   BUN/Creatinine Ratio 14 9 - 23   Sodium 139 134 - 144 mmol/L   Potassium 4.4 3.5 - 5.2 mmol/L   Chloride 102 96 - 106 mmol/L   CO2 22 20 - 29 mmol/L   Calcium 9.5 8.7 - 10.2 mg/dL   Total Protein 6.8 6.0 - 8.5 g/dL   Albumin 4.5 3.5 - 5.5 g/dL   Globulin, Total 2.3 1.5 - 4.5 g/dL   Albumin/Globulin Ratio 2.0 1.2 - 2.2   Bilirubin Total <0.2 0.0 - 1.2 mg/dL   Alkaline Phosphatase 77 39 - 117 IU/L   AST 25 0 - 40 IU/L   ALT 23 0 - 32 IU/L  Thyroid Panel With TSH  Result Value Ref Range   TSH 1.420 0.450 - 4.500 uIU/mL   T4, Total 5.1 4.5 - 12.0 ug/dL   T3 Uptake Ratio 26 24 - 39 %   Free Thyroxine Index 1.3 1.2 -  4.9      Assessment & Plan:   1. Bronchitis, acute, with bronchospasm - azithromycin (ZITHROMAX) 250 MG tablet; As directed  Dispense: 6 each; Refill: 0 - predniSONE (STERAPRED UNI-PAK 21 TAB) 10 MG (21) TBPK tablet; Take as directed  Dispense: 21 tablet; Refill: 0 - benzonatate (TESSALON) 200 MG capsule; Take  1 capsule (200 mg total) by mouth 2 (two) times daily as needed for cough.  Dispense: 20 capsule; Refill: 0   Continue all other maintenance medications as listed above.  Follow up plan: No follow-ups on file.  Educational handout given for Dysart PA-C [facility] [address]  [place], [place] [address] [phone]   07/16/2017, 2:47 PM

## 2017-07-16 NOTE — Telephone Encounter (Signed)
Called in today and patient aware

## 2017-07-16 NOTE — Telephone Encounter (Signed)
Message left for patient

## 2017-07-16 NOTE — Progress Notes (Signed)
Albuterol

## 2017-07-20 ENCOUNTER — Encounter: Payer: Self-pay | Admitting: *Deleted

## 2017-08-19 ENCOUNTER — Ambulatory Visit: Payer: BLUE CROSS/BLUE SHIELD | Admitting: Physician Assistant

## 2017-08-20 ENCOUNTER — Ambulatory Visit: Payer: BLUE CROSS/BLUE SHIELD | Admitting: Physician Assistant

## 2017-08-31 ENCOUNTER — Ambulatory Visit: Payer: BLUE CROSS/BLUE SHIELD | Admitting: Family Medicine

## 2017-08-31 ENCOUNTER — Encounter: Payer: Self-pay | Admitting: Physician Assistant

## 2017-08-31 ENCOUNTER — Encounter: Payer: Self-pay | Admitting: Family Medicine

## 2017-08-31 VITALS — BP 134/88 | HR 69 | Temp 98.1°F | Ht 68.0 in | Wt 169.0 lb

## 2017-08-31 DIAGNOSIS — Z114 Encounter for screening for human immunodeficiency virus [HIV]: Secondary | ICD-10-CM | POA: Diagnosis not present

## 2017-08-31 DIAGNOSIS — Z3009 Encounter for other general counseling and advice on contraception: Secondary | ICD-10-CM | POA: Diagnosis not present

## 2017-08-31 DIAGNOSIS — N926 Irregular menstruation, unspecified: Secondary | ICD-10-CM | POA: Diagnosis not present

## 2017-08-31 DIAGNOSIS — R7989 Other specified abnormal findings of blood chemistry: Secondary | ICD-10-CM | POA: Diagnosis not present

## 2017-08-31 NOTE — Progress Notes (Signed)
Subjective: CC: Abnormal menses PCP: Melanie Sleeper, PA-C UMP:NTIRW [name] is a 22 y.o. female presenting to clinic today for:  1.  Abnormal menstrual cycle Patient reports that she has had her menstrual cycle twice this month.  This is the first time that this is happened.  She normally has a 28-day cycle with her menstrual cycle lasting about 4 to 6 days.  Over the last month she has had increased cramping and heavier bleeding.  Her "first."  Was light and lasted about 5 days.  Her second menses started Sunday and was heavy.  It ended today.  She is currently on Loestrin Fe, and reports that she did not miss any doses.  She does have unprotected sex with her partner.  Last unprotected sex was last Tuesday.  No known thyroid disorder.  She did feel little dizzy last Sunday but that is since resolved.  There is thyroid disease in her paternal grandmother and paternal grandfather.  Patient has never been pregnant.   ROS: Per HPI  Allergies  Allergen Reactions  . Tamiflu [Oseltamivir Phosphate] Anaphylaxis    Swelling of throat, hands and itching, couldn't breathe   Past Medical History:  Diagnosis Date  . Asthma   . Depression   . GERD (gastroesophageal reflux disease)   . Lumbar herniated disc     Current Outpatient Medications:  .  albuterol (PROVENTIL HFA;VENTOLIN HFA) 108 (90 Base) MCG/ACT inhaler, Inhale 2 puffs into the lungs every 6 (six) hours as needed for wheezing or shortness of breath., Disp: 1 Inhaler, Rfl: 0 .  gabapentin (NEURONTIN) 400 MG capsule, Take 1 capsule (400 mg total) by mouth 3 (three) times daily., Disp: 90 capsule, Rfl: 5 .  norethindrone-ethinyl estradiol (JUNEL FE,GILDESS FE,LOESTRIN FE) 1-20 MG-MCG tablet, Take 1 tablet by mouth daily., Disp: 1 Package, Rfl: 12 Social History   Socioeconomic History  . Marital status: Single    Spouse name: Not on file  . Number of children: Not on file  . Years of education: Not on file  . Highest education level:  Not on file  Occupational History  . Not on file  Social Needs  . Financial resource strain: Not on file  . Food insecurity:    Worry: Not on file    Inability: Not on file  . Transportation needs:    Medical: Not on file    Non-medical: Not on file  Tobacco Use  . Smoking status: Passive Smoke Exposure - Never Smoker  . Smokeless tobacco: Never Used  Substance and Sexual Activity  . Alcohol use: Yes    Comment: occ  . Drug use: No  . Sexual activity: Not on file  Lifestyle  . Physical activity:    Days per week: Not on file    Minutes per session: Not on file  . Stress: Not on file  Relationships  . Social connections:    Talks on phone: Not on file    Gets together: Not on file    Attends religious service: Not on file    Active member of club or organization: Not on file    Attends meetings of clubs or organizations: Not on file    Relationship status: Not on file  . Intimate partner violence:    Fear of current or ex partner: Not on file    Emotionally abused: Not on file    Physically abused: Not on file    Forced sexual activity: Not on file  Other Topics Concern  .  Not on file  Social History Narrative  . Not on file   Family History  Problem Relation Age of Onset  . Alcohol abuse Father     Objective: Office vital signs reviewed. BP 134/88   Pulse 69   Temp 98.1 F (36.7 C) (Oral)   Ht '5\' 8"'$  (1.727 m)   Wt 169 lb (76.7 kg)   BMI 25.70 kg/m   Physical Examination:  General: Awake, alert, well nourished, No acute distress HEENT: Normal    Neck: No masses palpated. No lymphadenopathy; no thyromegaly or goiter.    Eyes: PERRLA, extraocular membranes intact, sclera white. No conjunctival pallor.  No exophthalmos. Cardio: regular rate and rhythm, S1S2 heard, no murmurs appreciated Pulm: clear to auscultation bilaterally, no wheezes, rhonchi or rales; normal work of breathing on room air Extremities: warm, well perfused, No edema, cyanosis or  clubbing; +2 pulses bilaterally MSK: Normal gait and normal station Skin: dry; intact; no rashes or lesions; normal temperature Neuro: No focal neurologic deficits.  No resting tremor.  Assessment/ Plan: 22 y.o. female   1. Abnormal menses We will check labs to evaluate for metabolic causes.  We discussed that abnormal menstrual cycles can occur for a number of reasons, including fluctuations in hormones, thyroid disorder, miscarriage, etc.  Will contact patient with results once available.  If normal laboratory work-up, would consider adding screening for STIs to Pap smear.  She will schedule her physical with Pap smear soon.  We also discussed other options for birth control.  Patient is interested in potentially switching to shot versus IUD.  She will contact our office to schedule appointment for this. - Beta hCG quant (ref lab) - TSH - CMP14+EGFR - CBC with Differential  2. Screening for HIV (human immunodeficiency virus) - HIV antibody (with reflex)  3. Encounter for counseling regarding contraception As above  Orders Placed This Encounter  Procedures  . Beta hCG quant (ref lab)  . TSH  . CMP14+EGFR  . CBC with Differential  . HIV antibody (with reflex)    Melanie Norlander, DO [place] [phone]

## 2017-08-31 NOTE — Patient Instructions (Signed)
We discussed other options for birth control.  See below.  I will contact you with results tomorrow.  We will make a plan pending these results.   Abnormal Uterine Bleeding Abnormal uterine bleeding means bleeding more than usual from your uterus. It can include:  Bleeding between periods.  Bleeding after sex.  Bleeding that is heavier than normal.  Periods that last longer than usual.  Bleeding after you have stopped having your period (menopause).  There are many problems that may cause this. You should see a doctor for any kind of bleeding that is not normal. Treatment depends on the cause of the bleeding. Follow these instructions at home:  Watch your condition for any changes.  Do not use tampons, douche, or have sex, if your doctor tells you not to.  Change your pads often.  Get regular well-woman exams. Make sure they include a pelvic exam and cervical cancer screening.  Keep all follow-up visits as told by your doctor. This is important. Contact a doctor if:  The bleeding lasts more than one week.  You feel dizzy at times.  You feel like you are going to throw up (nauseous).  You throw up. Get help right away if:  You pass out.  You have to change pads every hour.  You have belly (abdominal) pain.  You have a fever.  You get sweaty.  You get weak.  You passing large blood clots from your vagina. Summary  Abnormal uterine bleeding means bleeding more than usual from your uterus.  There are many problems that may cause this. You should see a doctor for any kind of bleeding that is not normal.  Treatment depends on the cause of the bleeding. This information is not intended to replace advice given to you by your health care provider. Make sure you discuss any questions you have with your health care provider. Document Released: 11/09/2008 Document Revised: 01/07/2016 Document Reviewed: 01/07/2016 Elsevier Interactive Patient Education  2017 [employer].  Contraception Choices Contraception, also called birth control, refers to methods or devices that prevent pregnancy. Hormonal methods Contraceptive implant A contraceptive implant is a thin, plastic tube that contains a hormone. It is inserted into the upper part of the arm. It can remain in place for up to 3 years. Progestin-only injections Progestin-only injections are injections of progestin, a synthetic form of the hormone progesterone. They are given every 3 months by a health care provider. Birth control pills Birth control pills are pills that contain hormones that prevent pregnancy. They must be taken once a day, preferably at the same time each day. Birth control patch The birth control patch contains hormones that prevent pregnancy. It is placed on the skin and must be changed once a week for three weeks and removed on the fourth week. A prescription is needed to use this method of contraception. Vaginal ring A vaginal ring contains hormones that prevent pregnancy. It is placed in the vagina for three weeks and removed on the fourth week. After that, the process is repeated with a new ring. A prescription is needed to use this method of contraception. Emergency contraceptive Emergency contraceptives prevent pregnancy after unprotected sex. They come in pill form and can be taken up to 5 days after sex. They work best the sooner they are taken after having sex. Most emergency contraceptives are available without a prescription. This method should not be used as your only form of birth control. Barrier methods Female condom A female condom is  a thin sheath that is worn over the penis during sex. Condoms keep sperm from going inside a woman's body. They can be used with a spermicide to increase their effectiveness. They should be disposed after a single use. Female condom A female condom is a soft, loose-fitting sheath that is put into the vagina before sex. The condom keeps sperm from  going inside a woman's body. They should be disposed after a single use. Diaphragm A diaphragm is a soft, dome-shaped barrier. It is inserted into the vagina before sex, along with a spermicide. The diaphragm blocks sperm from entering the uterus, and the spermicide kills sperm. A diaphragm should be left in the vagina for 6-8 hours after sex and removed within 24 hours. A diaphragm is prescribed and fitted by a health care provider. A diaphragm should be replaced every 1-2 years, after giving birth, after gaining more than 15 lb (6.8 kg), and after pelvic surgery. Cervical cap A cervical cap is a round, soft latex or plastic cup that fits over the cervix. It is inserted into the vagina before sex, along with spermicide. It blocks sperm from entering the uterus. The cap should be left in place for 6-8 hours after sex and removed within 48 hours. A cervical cap must be prescribed and fitted by a health care provider. It should be replaced every 2 years. Sponge A sponge is a soft, circular piece of polyurethane foam with spermicide on it. The sponge helps block sperm from entering the uterus, and the spermicide kills sperm. To use it, you make it wet and then insert it into the vagina. It should be inserted before sex, left in for at least 6 hours after sex, and removed and thrown away within 30 hours. Spermicides Spermicides are chemicals that kill or block sperm from entering the cervix and uterus. They can come as a cream, jelly, suppository, foam, or tablet. A spermicide should be inserted into the vagina with an applicator at least 10-15 minutes before sex to allow time for it to work. The process must be repeated every time you have sex. Spermicides do not require a prescription. Intrauterine contraception Intrauterine device (IUD) An IUD is a T-shaped device that is put in a woman's uterus. There are two types:  Hormone IUD.This type contains progestin, a synthetic form of the hormone  progesterone. This type can stay in place for 3-5 years.  Copper IUD.This type is wrapped in copper wire. It can stay in place for 10 years.  Permanent methods of contraception Female tubal ligation In this method, a woman's fallopian tubes are sealed, tied, or blocked during surgery to prevent eggs from traveling to the uterus. Hysteroscopic sterilization In this method, a small, flexible insert is placed into each fallopian tube. The inserts cause scar tissue to form in the fallopian tubes and block them, so sperm cannot reach an egg. The procedure takes about 3 months to be effective. Another form of birth control must be used during those 3 months. Female sterilization This is a procedure to tie off the tubes that carry sperm (vasectomy). After the procedure, the man can still ejaculate fluid (semen). Natural planning methods Natural family planning In this method, a couple does not have sex on days when the woman could become pregnant. Calendar method This means keeping track of the length of each menstrual cycle, identifying the days when pregnancy can happen, and not having sex on those days. Ovulation method In this method, a couple avoids sex during ovulation.  Symptothermal method This method involves not having sex during ovulation. The woman typically checks for ovulation by watching changes in her temperature and in the consistency of cervical mucus. Post-ovulation method In this method, a couple waits to have sex until after ovulation. Summary  Contraception, also called birth control, means methods or devices that prevent pregnancy.  Hormonal methods of contraception include implants, injections, pills, patches, vaginal rings, and emergency contraceptives.  Barrier methods of contraception can include female condoms, female condoms, diaphragms, cervical caps, sponges, and spermicides.  There are two types of IUDs (intrauterine devices). An IUD can be put in a woman's uterus to  prevent pregnancy for 3-5 years.  Permanent sterilization can be done through a procedure for males, females, or both.  Natural family planning methods involve not having sex on days when the woman could become pregnant. This information is not intended to replace advice given to you by your health care provider. Make sure you discuss any questions you have with your health care provider. Document Released: 01/12/2005 Document Revised: 02/15/2016 Document Reviewed: 02/15/2016 Elsevier Interactive Patient Education  2018 [employer].

## 2017-09-01 LAB — CMP14+EGFR
ALK PHOS: 59 IU/L (ref 39–117)
ALT: 27 IU/L (ref 0–32)
AST: 26 IU/L (ref 0–40)
Albumin/Globulin Ratio: 2.1 (ref 1.2–2.2)
Albumin: 4.5 g/dL (ref 3.5–5.5)
BUN/Creatinine Ratio: 9 (ref 9–23)
BUN: 8 mg/dL (ref 6–20)
Bilirubin Total: 0.8 mg/dL (ref 0.0–1.2)
CALCIUM: 9.6 mg/dL (ref 8.7–10.2)
CO2: 23 mmol/L (ref 20–29)
CREATININE: 0.86 mg/dL (ref 0.57–1.00)
Chloride: 102 mmol/L (ref 96–106)
GFR calc Af Amer: 111 mL/min/{1.73_m2} (ref >59)
GFR, EST NON AFRICAN AMERICAN: 96 mL/min/{1.73_m2} (ref >59)
GLUCOSE: 87 mg/dL (ref 65–99)
Globulin, Total: 2.1 g/dL (ref 1.5–4.5)
Potassium: 4.2 mmol/L (ref 3.5–5.2)
Sodium: 141 mmol/L (ref 134–144)
Total Protein: 6.6 g/dL (ref 6.0–8.5)

## 2017-09-01 LAB — CBC WITH DIFFERENTIAL/PLATELET
Basophils Absolute: 0 10*3/uL (ref 0.0–0.2)
Basos: 1 %
EOS (ABSOLUTE): 0.1 10*3/uL (ref 0.0–0.4)
EOS: 3 %
HEMATOCRIT: 41.6 % (ref 34.0–46.6)
Hemoglobin: 13.9 g/dL (ref 11.1–15.9)
IMMATURE GRANULOCYTES: 0 %
Immature Grans (Abs): 0 10*3/uL (ref 0.0–0.1)
Lymphocytes Absolute: 2.3 10*3/uL (ref 0.7–3.1)
Lymphs: 44 %
MCH: 31.2 pg (ref 26.6–33.0)
MCHC: 33.4 g/dL (ref 31.5–35.7)
MCV: 93 fL (ref 79–97)
MONOS ABS: 0.4 10*3/uL (ref 0.1–0.9)
Monocytes: 7 %
NEUTROS PCT: 45 %
Neutrophils Absolute: 2.3 10*3/uL (ref 1.4–7.0)
PLATELETS: 234 10*3/uL (ref 150–450)
RBC: 4.46 x10E6/uL (ref 3.77–5.28)
RDW: 13.8 % (ref 12.3–15.4)
WBC: 5.1 10*3/uL (ref 3.4–10.8)

## 2017-09-01 LAB — BETA HCG QUANT (REF LAB)

## 2017-09-01 LAB — TSH: TSH: 4.52 u[IU]/mL — AB (ref 0.450–4.500)

## 2017-09-01 LAB — HIV ANTIBODY (ROUTINE TESTING W REFLEX): HIV Screen 4th Generation wRfx: NONREACTIVE

## 2017-09-01 NOTE — Addendum Note (Signed)
Addended byDory Peru: Luisdaniel Kenton C on: 09/01/2017 03:02 PM   Modules accepted: Orders

## 2017-09-28 ENCOUNTER — Ambulatory Visit (INDEPENDENT_AMBULATORY_CARE_PROVIDER_SITE_OTHER): Payer: BLUE CROSS/BLUE SHIELD | Admitting: Physician Assistant

## 2017-09-28 ENCOUNTER — Encounter: Payer: Self-pay | Admitting: Physician Assistant

## 2017-09-28 VITALS — BP 123/81 | HR 93 | Ht 68.0 in | Wt 169.2 lb

## 2017-09-28 DIAGNOSIS — Z3041 Encounter for surveillance of contraceptive pills: Secondary | ICD-10-CM

## 2017-09-28 DIAGNOSIS — K529 Noninfective gastroenteritis and colitis, unspecified: Secondary | ICD-10-CM | POA: Diagnosis not present

## 2017-09-28 DIAGNOSIS — Z01419 Encounter for gynecological examination (general) (routine) without abnormal findings: Secondary | ICD-10-CM | POA: Diagnosis not present

## 2017-09-28 DIAGNOSIS — Z6825 Body mass index (BMI) 25.0-25.9, adult: Secondary | ICD-10-CM | POA: Diagnosis not present

## 2017-09-28 MED ORDER — NORETHINDRONE-ETH ESTRADIOL 0.5-35 MG-MCG PO TABS: 1.0000 | ORAL_TABLET | Freq: Every day | ORAL | 11 refills | Status: DC

## 2017-09-29 LAB — CMP14+EGFR
ALK PHOS: 56 IU/L (ref 39–117)
ALT: 30 IU/L (ref 0–32)
AST: 32 IU/L (ref 0–40)
Albumin/Globulin Ratio: 2 (ref 1.2–2.2)
Albumin: 4.3 g/dL (ref 3.5–5.5)
BUN/Creatinine Ratio: 9 (ref 9–23)
BUN: 9 mg/dL (ref 6–20)
Bilirubin Total: 0.7 mg/dL (ref 0.0–1.2)
CALCIUM: 9.8 mg/dL (ref 8.7–10.2)
CO2: 24 mmol/L (ref 20–29)
CREATININE: 0.99 mg/dL (ref 0.57–1.00)
Chloride: 102 mmol/L (ref 96–106)
GFR calc Af Amer: 94 mL/min/{1.73_m2} (ref >59)
GFR, EST NON AFRICAN AMERICAN: 81 mL/min/{1.73_m2} (ref >59)
GLOBULIN, TOTAL: 2.1 g/dL (ref 1.5–4.5)
Glucose: 97 mg/dL (ref 65–99)
Potassium: 4.4 mmol/L (ref 3.5–5.2)
SODIUM: 138 mmol/L (ref 134–144)
Total Protein: 6.4 g/dL (ref 6.0–8.5)

## 2017-09-29 LAB — CBC WITH DIFFERENTIAL/PLATELET
Basophils Absolute: 0 10*3/uL (ref 0.0–0.2)
Basos: 0 %
EOS (ABSOLUTE): 0.1 10*3/uL (ref 0.0–0.4)
Eos: 2 %
HEMATOCRIT: 42.1 % (ref 34.0–46.6)
Hemoglobin: 14.4 g/dL (ref 11.1–15.9)
IMMATURE GRANULOCYTES: 0 %
Immature Grans (Abs): 0 10*3/uL (ref 0.0–0.1)
Lymphocytes Absolute: 1.5 10*3/uL (ref 0.7–3.1)
Lymphs: 24 %
MCH: 31.2 pg (ref 26.6–33.0)
MCHC: 34.2 g/dL (ref 31.5–35.7)
MCV: 91 fL (ref 79–97)
Monocytes Absolute: 0.4 10*3/uL (ref 0.1–0.9)
Monocytes: 6 %
NEUTROS PCT: 68 %
Neutrophils Absolute: 4.1 10*3/uL (ref 1.4–7.0)
PLATELETS: 246 10*3/uL (ref 150–450)
RBC: 4.61 x10E6/uL (ref 3.77–5.28)
RDW: 13.7 % (ref 12.3–15.4)
WBC: 6.1 10*3/uL (ref 3.4–10.8)

## 2017-09-29 LAB — TSH: TSH: 1.94 u[IU]/mL (ref 0.450–4.500)

## 2017-09-29 NOTE — Progress Notes (Signed)
BP 123/81   Pulse 93   Ht _0  (1.727 m)   Wt 169 lb 3.2 oz (76.7 kg)   BMI 25.73 kg/m    Subjective:    Patient ID: Melanie Mcdaniel, female    DOB: 02-Nov-1995, 22 y.o.   MRN: [id number]  HPI: Melanie Mcdaniel is a 22 y.o. female presenting on 09/28/2017 for Annual Exam (pap and blood work)  This patient comes in for annual well physical examination. All medications are reviewed today. There are no reports of any problems with the medications. All of the medical conditions are reviewed and updated.  Lab work is reviewed and will be ordered as medically necessary. There are no new problems reported with today's visit.  Patient reports doing well overall. This was her first PAP smear exam.  Past Medical History:  Diagnosis Date  . Asthma   . Depression   . GERD (gastroesophageal reflux disease)   . Lumbar herniated disc    Relevant past medical, surgical, family and social history reviewed and updated as indicated. Interim medical history since our last visit reviewed. Allergies and medications reviewed and updated. DATA REVIEWED: CHART IN EPIC  Family History reviewed for pertinent findings.  Review of Systems  Constitutional: Negative.  Negative for activity change, fatigue and fever.  HENT: Negative.   Eyes: Negative.   Respiratory: Negative.  Negative for cough.   Cardiovascular: Negative.  Negative for chest pain.  Gastrointestinal: Negative.  Negative for abdominal pain.  Endocrine: Negative.   Genitourinary: Negative.  Negative for dysuria.  Musculoskeletal: Negative.   Skin: Negative.   Neurological: Negative.     Allergies as of 09/28/2017      Reactions   Tamiflu [oseltamivir Phosphate] Anaphylaxis   Swelling of throat, hands and itching, couldn't breathe      Medication List        Accurate as of 09/28/17 11:59 PM. Always use your most recent med list.          albuterol 108 (90 Base) MCG/ACT inhaler Commonly known as:  PROVENTIL HFA;VENTOLIN HFA Inhale 2  puffs into the lungs every 6 (six) hours as needed for wheezing or shortness of breath.   gabapentin 400 MG capsule Commonly known as:  NEURONTIN Take 1 capsule (400 mg total) by mouth 3 (three) times daily.   norethindrone-ethinyl estradiol 0.5-35 MG-MCG tablet Commonly known as:  NECON,BREVICON,MODICON Take 1 tablet by mouth daily.          Objective:    BP 123/81   Pulse 93   Ht _1  (1.727 m)   Wt 169 lb 3.2 oz (76.7 kg)   BMI 25.73 kg/m   Allergies  Allergen Reactions  . Tamiflu [Oseltamivir Phosphate] Anaphylaxis    Swelling of throat, hands and itching, couldn't breathe    Wt Readings from Last 3 Encounters:  09/28/17 169 lb 3.2 oz (76.7 kg)  08/31/17 169 lb (76.7 kg)  07/15/17 170 lb 3.2 oz (77.2 kg)    Physical Exam  Constitutional: She is oriented to person, place, and time. She appears well-developed and well-nourished.  HENT:  Head: Normocephalic and atraumatic.  Eyes: Pupils are equal, round, and reactive to light. Conjunctivae and EOM are normal.  Neck: Normal range of motion. Neck supple.  Cardiovascular: Normal rate, regular rhythm, normal heart sounds and intact distal pulses.  Pulmonary/Chest: Effort normal and breath sounds normal. Right breast exhibits no mass, no skin change and no tenderness. Left breast exhibits no mass, no skin change and  no tenderness. No breast tenderness, discharge or bleeding. Breasts are symmetrical.  Abdominal: Soft. Bowel sounds are normal.  Genitourinary: Vagina normal and uterus normal. Rectal exam shows no fissure. There is no tenderness or lesion on the right labia. There is no tenderness or lesion on the left labia. Uterus is not deviated, not enlarged and not tender. Cervix exhibits no motion tenderness, no discharge and no friability. Right adnexum displays no mass, no tenderness and no fullness. Left adnexum displays no mass, no tenderness and no fullness. No tenderness or bleeding in the vagina. No vaginal discharge  found.  Neurological: She is alert and oriented to person, place, and time. She has normal reflexes.  Skin: Skin is warm and dry. No rash noted.  Psychiatric: She has a normal mood and affect. Her behavior is normal. Judgment and thought content normal.    Results for orders placed or performed in visit on 09/28/17  CBC with Differential/Platelet  Result Value Ref Range   WBC 6.1 3.4 - 10.8 x10E3/uL   RBC 4.61 3.77 - 5.28 x10E6/uL   Hemoglobin 14.4 11.1 - 15.9 g/dL   Hematocrit 42.1 34.0 - 46.6 %   MCV 91 79 - 97 fL   MCH 31.2 26.6 - 33.0 pg   MCHC 34.2 31.5 - 35.7 g/dL   RDW 13.7 12.3 - 15.4 %   Platelets 246 150 - 450 x10E3/uL   Neutrophils 68 Not Estab. %   Lymphs 24 Not Estab. %   Monocytes 6 Not Estab. %   Eos 2 Not Estab. %   Basos 0 Not Estab. %   Neutrophils Absolute 4.1 1.4 - 7.0 x10E3/uL   Lymphocytes Absolute 1.5 0.7 - 3.1 x10E3/uL   Monocytes Absolute 0.4 0.1 - 0.9 x10E3/uL   EOS (ABSOLUTE) 0.1 0.0 - 0.4 x10E3/uL   Basophils Absolute 0.0 0.0 - 0.2 x10E3/uL   Immature Granulocytes 0 Not Estab. %   Immature Grans (Abs) 0.0 0.0 - 0.1 x10E3/uL  CMP14+EGFR  Result Value Ref Range   Glucose 97 65 - 99 mg/dL   BUN 9 6 - 20 mg/dL   Creatinine, Ser 0.99 0.57 - 1.00 mg/dL   GFR calc non Af Amer 81 >59 mL/min/1.73   GFR calc Af Amer 94 >59 mL/min/1.73   BUN/Creatinine Ratio 9 9 - 23   Sodium 138 134 - 144 mmol/L   Potassium 4.4 3.5 - 5.2 mmol/L   Chloride 102 96 - 106 mmol/L   CO2 24 20 - 29 mmol/L   Calcium 9.8 8.7 - 10.2 mg/dL   Total Protein 6.4 6.0 - 8.5 g/dL   Albumin 4.3 3.5 - 5.5 g/dL   Globulin, Total 2.1 1.5 - 4.5 g/dL   Albumin/Globulin Ratio 2.0 1.2 - 2.2   Bilirubin Total 0.7 0.0 - 1.2 mg/dL   Alkaline Phosphatase 56 39 - 117 IU/L   AST 32 0 - 40 IU/L   ALT 30 0 - 32 IU/L  TSH  Result Value Ref Range   TSH 1.940 0.450 - 4.500 uIU/mL      Assessment & Plan:   1. Well female exam with routine gynecological exam - CBC with Differential/Platelet -  CMP14+EGFR - TSH - Pap IG, CT/NG w/ reflex HPV when ASC-U  2. Encounter for surveillance of contraceptive pills - norethindrone-ethinyl estradiol (NECON,BREVICON,MODICON) 0.5-35 MG-MCG tablet; Take 1 tablet by mouth daily.  Dispense: 1 Package; Refill: 11   Continue all other maintenance medications as listed above.  Follow up plan: No follow-ups on file.  Educational handout given for [place] PA-C [facility] [address]  [place], [place] [address] [phone]   09/29/2017, 8:59 AM

## 2017-10-01 LAB — PAP IG, CT-NG, RFX HPV ASCU
CHLAMYDIA, NUC. ACID AMP: NEGATIVE
Gonococcus by Nucleic Acid Amp: NEGATIVE
PAP SMEAR COMMENT: 0

## 2017-10-14 ENCOUNTER — Ambulatory Visit: Payer: BLUE CROSS/BLUE SHIELD | Admitting: Family Medicine

## 2017-10-21 ENCOUNTER — Encounter: Payer: Self-pay | Admitting: Physician Assistant

## 2017-11-20 DIAGNOSIS — Z888 Allergy status to other drugs, medicaments and biological substances status: Secondary | ICD-10-CM | POA: Diagnosis not present

## 2017-11-20 DIAGNOSIS — S060X0A Concussion without loss of consciousness, initial encounter: Secondary | ICD-10-CM | POA: Diagnosis not present

## 2017-11-20 DIAGNOSIS — G8911 Acute pain due to trauma: Secondary | ICD-10-CM | POA: Diagnosis not present

## 2017-11-20 DIAGNOSIS — R51 Headache: Secondary | ICD-10-CM | POA: Diagnosis not present

## 2017-11-21 DIAGNOSIS — R51 Headache: Secondary | ICD-10-CM | POA: Diagnosis not present

## 2018-03-01 ENCOUNTER — Telehealth: Payer: Self-pay | Admitting: Physician Assistant

## 2018-03-08 ENCOUNTER — Ambulatory Visit: Payer: BLUE CROSS/BLUE SHIELD | Admitting: Physician Assistant

## 2018-03-10 ENCOUNTER — Encounter: Payer: Self-pay | Admitting: Physician Assistant

## 2018-06-29 DIAGNOSIS — S8002XA Contusion of left knee, initial encounter: Secondary | ICD-10-CM | POA: Diagnosis not present

## 2018-10-03 ENCOUNTER — Encounter (HOSPITAL_COMMUNITY): Payer: Self-pay | Admitting: Emergency Medicine

## 2018-10-03 ENCOUNTER — Emergency Department (HOSPITAL_COMMUNITY)
Admission: EM | Admit: 2018-10-03 | Discharge: 2018-10-03 | Disposition: A | Discharge status: Home / Self Care | Payer: BC Managed Care – PPO | Attending: Emergency Medicine | Admitting: Emergency Medicine

## 2018-10-03 ENCOUNTER — Other Ambulatory Visit: Payer: Self-pay

## 2018-10-03 ENCOUNTER — Emergency Department (HOSPITAL_COMMUNITY): Payer: BC Managed Care – PPO

## 2018-10-03 DIAGNOSIS — Z888 Allergy status to other drugs, medicaments and biological substances status: Secondary | ICD-10-CM | POA: Insufficient documentation

## 2018-10-03 DIAGNOSIS — Y9301 Activity, walking, marching and hiking: Secondary | ICD-10-CM | POA: Insufficient documentation

## 2018-10-03 DIAGNOSIS — S93402A Sprain of unspecified ligament of left ankle, initial encounter: Secondary | ICD-10-CM | POA: Insufficient documentation

## 2018-10-03 DIAGNOSIS — Z793 Long term (current) use of hormonal contraceptives: Secondary | ICD-10-CM | POA: Insufficient documentation

## 2018-10-03 DIAGNOSIS — W1842XA Slipping, tripping and stumbling without falling due to stepping into hole or opening, initial encounter: Secondary | ICD-10-CM | POA: Insufficient documentation

## 2018-10-03 DIAGNOSIS — Y929 Unspecified place or not applicable: Secondary | ICD-10-CM | POA: Diagnosis not present

## 2018-10-03 DIAGNOSIS — S99912A Unspecified injury of left ankle, initial encounter: Secondary | ICD-10-CM | POA: Diagnosis not present

## 2018-10-03 DIAGNOSIS — J45909 Unspecified asthma, uncomplicated: Secondary | ICD-10-CM | POA: Diagnosis not present

## 2018-10-03 DIAGNOSIS — M7989 Other specified soft tissue disorders: Secondary | ICD-10-CM | POA: Diagnosis not present

## 2018-10-03 DIAGNOSIS — Y999 Unspecified external cause status: Secondary | ICD-10-CM | POA: Insufficient documentation

## 2018-10-03 MED ORDER — IBUPROFEN 800 MG PO TABS: 800.0000 mg | ORAL_TABLET | Freq: Three times a day (TID) | ORAL | 0 refills | Status: DC | PRN

## 2018-10-03 NOTE — ED Provider Notes (Signed)
[facility]-EMERGENCY DEPT Provider Note   CSN: [id number] Arrival date & time: 10/03/18  1955     History   Chief Complaint Chief Complaint  Patient presents with  . Ankle Injury    HPI Melanie Mcdaniel is a 23 y.o. female.     The history is provided by the patient and medical records. No language interpreter was used.  Ankle Injury   Melanie Mcdaniel is a 23 y.o. female who presents to the Emergency Department complaining of acute onset of left ankle pain after stepping in a ditch and twisting her left ankle 2 days ago.  Associated with swelling, mostly to the lateral aspect of the ankle.  She has tried elevating, but has not had much improvement.  No numbness or weakness.   Past Medical History:  Diagnosis Date  . Asthma   . Depression   . GERD (gastroesophageal reflux disease)   . Lumbar herniated disc     Patient Active Problem List   Diagnosis Date Noted  . DDD (degenerative disc disease), lumbar 02/19/2017  . Well female exam with routine gynecological exam 02/19/2017  . S/P lumbar discectomy 04/23/2014    Past Surgical History:  Procedure Laterality Date  . ADENOIDECTOMY    . LUMBAR LAMINECTOMY Right 04/23/2014   Procedure: Right L5-S1 Microdiscectomy;  Surgeon: Eldred Manges, MD;  Location: [facility];  Service: Orthopedics;  Laterality: Right;  . TONSILLECTOMY    . 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      Home Medications    Prior to Admission medications   Medication Sig Start Date End Date Taking? Authorizing Provider  albuterol (PROVENTIL HFA;VENTOLIN HFA) 108 (90 Base) MCG/ACT inhaler Inhale 2 puffs into the lungs every 6 (six) hours as needed for wheezing or shortness of breath. Patient not taking: Reported on 10/03/2018 07/16/17   Remus Loffler, PA-C  gabapentin (NEURONTIN) 400 MG capsule Take 1 capsule (400 mg total) by mouth 3 (three) times daily. Patient not taking:  Reported on 10/03/2018 06/23/17   Remus Loffler, PA-C  ibuprofen (ADVIL) 800 MG tablet Take 1 tablet (800 mg total) by mouth every 8 (eight) hours as needed for mild pain or moderate pain. 10/03/18   Ward, Chase Picket, PA-C  norethindrone-ethinyl estradiol (NECON,BREVICON,MODICON) 0.5-35 MG-MCG tablet Take 1 tablet by mouth daily. Patient not taking: Reported on 10/03/2018 09/28/17   Remus Loffler, PA-C  norethindrone-ethinyl estradiol (JUNEL FE,GILDESS FE,LOESTRIN FE) 1-20 MG-MCG tablet Take 1 tablet by mouth daily. 06/23/17 09/28/17  Remus Loffler, PA-C    Family History Family History  Problem Relation Age of Onset  . Alcohol abuse Father     Social History Social History   Tobacco Use  . Smoking status: Passive Smoke Exposure - Never Smoker  . Smokeless tobacco: Never Used  Substance Use Topics  . Alcohol use: Yes    Comment: occ  . Drug use: No     Allergies   Tamiflu [oseltamivir phosphate]   Review of Systems Review of Systems  Musculoskeletal: Positive for arthralgias and myalgias.  Skin: Negative for wound.  Neurological: Negative for weakness and numbness.  All other systems reviewed and are negative.    Physical Exam Updated Vital Signs BP (!) 177/115 (BP Location: Left Arm)   Pulse 76   Temp 99.4 F (37.4 C) (Oral)   Resp 16   Ht 5\' 8"  (1.727 m)  Wt 79.4 kg   LMP 09/05/2018   SpO2 99%   BMI 26.61 kg/m   Physical Exam Vitals signs and nursing note reviewed.  Constitutional:      General: She is not in acute distress.    Appearance: She is well-developed.  HENT:     Head: Normocephalic and atraumatic.  Neck:     Musculoskeletal: Neck supple.  Cardiovascular:     Rate and Rhythm: Normal rate and regular rhythm.     Heart sounds: Normal heart sounds. No murmur.  Pulmonary:     Effort: Pulmonary effort is normal. No respiratory distress.     Breath sounds: Normal breath sounds. No wheezing or rales.  Musculoskeletal:     Comments: Left ankle with  tenderness of and around the lateral malleolus. + Associated swelling.  Full range of motion, although painful.  No pain to fifth metatarsal area or navicular region.  Achilles intact.  Good pedal pulse and cap refill of the toes.  Sensation intact.   Skin:    General: Skin is warm and dry.  Neurological:     Mental Status: She is alert.      ED Treatments / Results  Labs (all labs ordered are listed, but only abnormal results are displayed) Labs Reviewed - No data to display  EKG None  Radiology Dg Ankle Complete Left  Result Date: 10/03/2018 CLINICAL DATA:  Swelling to LEFT foot and ankle, twisted her foot/ankle trying to get away from a guy at a bar EXAM: LEFT ANKLE COMPLETE - 3+ VIEW COMPARISON:  None FINDINGS: Osseous mineralization normal. Joint spaces preserved. Corticated unfused ossicle at tip of lateral malleolus, old. Soft tissue swelling, greatest laterally. No acute fracture, dislocation, or bone destruction. IMPRESSION: No acute osseous abnormalities. Electronically Signed   By: Lavonia Dana M.D.   On: 10/03/2018 20:36   Dg Foot Complete Left  Result Date: 10/03/2018 CLINICAL DATA:  Swelling to LEFT foot and ankle, twisted her foot/ankle trying to get away from a guy at a bar EXAM: LEFT FOOT - COMPLETE 3+ VIEW COMPARISON:  05/24/2015 FINDINGS: Osseous mineralization normal. Mild dorsal soft tissue swelling overlying the metatarsals. No acute fracture, dislocation, or bone destruction. Corticated ossicle identified adjacent to tip of lateral malleolus, appears old. IMPRESSION: No acute osseous abnormalities. Electronically Signed   By: Lavonia Dana M.D.   On: 10/03/2018 20:37    Procedures Procedures (including critical care time)  Medications Ordered in ED Medications - No data to display   Initial Impression / Assessment and Plan / ED Course  I have reviewed the triage vital signs and the nursing notes.  Pertinent labs & imaging results that were available during my  care of the patient were reviewed by me and considered in my medical decision making (see chart for details).        Melanie Mcdaniel is a 23 y.o. female who presents to ED for left ankle pain and swelling after inversion injury 2 days ago stepping in a ditch.  Neurovascularly intact on exam.  Does have associated soft tissue swelling.  No signs of compartment syndrome.  X-ray independently reviewed which showed no acute findings.  ASO brace and crutches provided.  Rx for 800 mg ibuprofen. She has a PCP, likely can follow up with them if not improving. Reasons to return to ER were discussed as well as home care instructions. All questions answered.    Final Clinical Impressions(s) / ED Diagnoses   Final diagnoses:  Sprain of left  ankle, unspecified ligament, initial encounter    ED Discharge Orders         Ordered    ibuprofen (ADVIL) 800 MG tablet  Every 8 hours PRN     10/03/18 2102           Ward, Chase PicketJaime Pilcher, PA-C 10/03/18 2113    Melene PlanFloyd, Dan, DO 10/03/18 2203

## 2018-10-03 NOTE — ED Triage Notes (Signed)
Pt reports stepping into a ditch and twisted left foot and ankle. Bruising and swelling noted pulses, sensation and movement  Present distally. Pt reports injury occurred on 10/01/2018

## 2018-10-03 NOTE — Discharge Instructions (Signed)
Ibuprofen as needed for pain. You can take Tylenol as well if needed for additional pain relief. Ice and alleviate for additional pain relief. If your symptoms persist without improvement in one week, please follow up with your primary care doctor.    TREATMENT  Rest, ice, elevation, and compression are the basic modes of treatment.    HOME CARE INSTRUCTIONS  Apply ice to the sore area for 15 to 20 minutes, 3 to 4 times per day. Do this while you are awake for the first 2 days. This can be stopped when the swelling goes away. Put the ice in a plastic bag and place a towel between the bag of ice and your skin.  Keep your leg elevated when possible to lessen swelling.  You may take off your ankle stabilizer at night and to take a shower or bath. Wiggle your toes several times per day if you are able.  ACTIVITY:            - Weight bearing as tolerated - if you can do it, do it. If it hurts, stop.             - Exercises should be limited to pain free range of motion            - Can start mobilization by tracing the alphabet with your foot in the air.      Return to ER if your toes are numb or blue, new or worsening symptoms develop or you have any additional concerns.

## 2018-12-02 ENCOUNTER — Ambulatory Visit (INDEPENDENT_AMBULATORY_CARE_PROVIDER_SITE_OTHER): Payer: BC Managed Care – PPO | Admitting: Family

## 2018-12-02 ENCOUNTER — Encounter: Payer: Self-pay | Admitting: Family

## 2018-12-02 ENCOUNTER — Other Ambulatory Visit: Payer: Self-pay

## 2018-12-02 DIAGNOSIS — U071 COVID-19: Secondary | ICD-10-CM

## 2018-12-02 MED ORDER — ALBUTEROL SULFATE HFA 108 (90 BASE) MCG/ACT IN AERS: 2.0000 | INHALATION_SPRAY | Freq: Four times a day (QID) | RESPIRATORY_TRACT | 0 refills | Status: AC | PRN

## 2018-12-02 MED ORDER — BENZONATATE 200 MG PO CAPS: 200.0000 mg | ORAL_CAPSULE | Freq: Three times a day (TID) | ORAL | 1 refills | Status: DC | PRN

## 2018-12-02 NOTE — Progress Notes (Signed)
Virtual Visit via telephone Note Due to COVID-19 pandemic this visit was conducted virtually. This visit type was conducted due to national recommendations for restrictions regarding the COVID-19 Pandemic (e.g. social distancing, sheltering in place) in an effort to limit this patient's exposure and mitigate transmission in our community. All issues noted in this document were discussed and addressed.  A physical exam was not performed with this format.  I connected with Melanie Mcdaniel on 12/02/18 at 10:53 AM  by telephone and verified that I am speaking with the correct person using two identifiers. Melanie Mcdaniel is currently located at home and no one is currently with her during visit. The provider, Evelina Dun, FNP is located in their office at time of visit.  I discussed the limitations, risks, security and privacy concerns of performing an evaluation and management service by telephone and the availability of in person appointments. I also discussed with the patient that there may be a patient responsible charge related to this service. The patient expressed understanding and agreed to proceed.   History and Present Illness:  PT calls the office today with cough that started Monday evening. She went to get COVID tested on Wednesday and got her results today that she is positive.  Cough This is a new problem. The current episode started in the past 7 days. The problem has been waxing and waning. The problem occurs every few minutes. The cough is productive of purulent sputum. Associated symptoms include chills, ear pain (right), a fever (100), headaches, myalgias, nasal congestion, postnasal drip and a sore throat. Pertinent negatives include no shortness of breath (chest tightness) or wheezing. Risk factors for lung disease include smoking/tobacco exposure. She has tried rest and prescription cough suppressant for the symptoms. The treatment provided mild relief.      Review of Systems   Constitutional: Positive for chills and fever (100).  HENT: Positive for ear pain (right), postnasal drip and sore throat.   Respiratory: Positive for cough. Negative for shortness of breath (chest tightness) and wheezing.   Musculoskeletal: Positive for myalgias.  Neurological: Positive for headaches.     Observations/Objective: No SOB or distress noted, coarse nonproductive cough  Assessment and Plan: 1. COVID-19 virus detected Force fluids Tylenol as needed Self isolation discussed Call if symptoms worsen or do not improve  - benzonatate (TESSALON) 200 MG capsule; Take 1 capsule (200 mg total) by mouth 3 (three) times daily as needed.  Dispense: 30 capsule; Refill: 1 - albuterol (VENTOLIN HFA) 108 (90 Base) MCG/ACT inhaler; Inhale 2 puffs into the lungs every 6 (six) hours as needed for wheezing or shortness of breath.  Dispense: 8 g; Refill: 0 - MyChart COVID-19 home monitoring program; Future     I discussed the assessment and treatment plan with the patient. The patient was provided an opportunity to ask questions and all were answered. The patient agreed with the plan and demonstrated an understanding of the instructions.   The patient was advised to call back or seek an in-person evaluation if the symptoms worsen or if the condition fails to improve as anticipated.  The above assessment and management plan was discussed with the patient. The patient verbalized understanding of and has agreed to the management plan. Patient is aware to call the clinic if symptoms persist or worsen. Patient is aware when to return to the clinic for a follow-up visit. Patient educated on when it is appropriate to go to the emergency department.   Time call ended: 11:10  I provided 17 minutes of non-face-to-face time during this encounter.    Jannifer Rodney, FNP

## 2018-12-06 ENCOUNTER — Encounter: Payer: Self-pay | Admitting: Family Medicine

## 2018-12-06 ENCOUNTER — Other Ambulatory Visit: Payer: Self-pay

## 2018-12-06 ENCOUNTER — Ambulatory Visit (INDEPENDENT_AMBULATORY_CARE_PROVIDER_SITE_OTHER): Payer: BC Managed Care – PPO | Admitting: Family Medicine

## 2018-12-06 DIAGNOSIS — U071 COVID-19: Secondary | ICD-10-CM | POA: Diagnosis not present

## 2018-12-06 MED ORDER — HYDROCODONE-HOMATROPINE 5-1.5 MG/5ML PO SYRP: 5.0000 mL | ORAL_SOLUTION | Freq: Four times a day (QID) | ORAL | 0 refills | Status: DC | PRN

## 2018-12-06 NOTE — Progress Notes (Signed)
Virtual Visit via Telephone Note  I connected with Melanie Mcdaniel on 12/06/18 at 11:16 AM by telephone and verified that I am speaking with the correct person using two identifiers. Melanie Mcdaniel is currently located at home and nobody is currently with her during this visit. The provider, Gwenlyn Fudge, FNP is located in their home at time of visit.  I discussed the limitations, risks, security and privacy concerns of performing an evaluation and management service by telephone and the availability of in person appointments. I also discussed with the patient that there may be a patient responsible charge related to this service. The patient expressed understanding and agreed to proceed.  Subjective: PCP: Remus Loffler, PA-C  Chief Complaint  Patient presents with  . Cough   Patient concerned about her cough that is not relieved with Tessalon Perles 200 mg TID PRN. She was swabbed six days ago and got back positive COVID-19 test results four days ago. Her cough initially started 8 days ago. She feels the cough is down in her chest and at times she feels like there is a weight on her chest. Denies shortness of breath or fever. She does experience wheezing at night which is relieved with the Albuterol inhaler. She has been taking zinc, elderberry, honey, tea, and hot showers/baths. She is due to return to work for the weekend and would like her cough to be gone.    ROS: Per HPI  Current Outpatient Medications:  .  albuterol (VENTOLIN HFA) 108 (90 Base) MCG/ACT inhaler, Inhale 2 puffs into the lungs every 6 (six) hours as needed for wheezing or shortness of breath., Disp: 8 g, Rfl: 0 .  benzonatate (TESSALON) 200 MG capsule, Take 1 capsule (200 mg total) by mouth 3 (three) times daily as needed., Disp: 30 capsule, Rfl: 1 .  gabapentin (NEURONTIN) 400 MG capsule, Take 1 capsule (400 mg total) by mouth 3 (three) times daily. (Patient not taking: Reported on 10/03/2018), Disp: 90 capsule, Rfl: 5 .   ibuprofen (ADVIL) 800 MG tablet, Take 1 tablet (800 mg total) by mouth every 8 (eight) hours as needed for mild pain or moderate pain., Disp: 21 tablet, Rfl: 0 .  norethindrone-ethinyl estradiol (NECON,BREVICON,MODICON) 0.5-35 MG-MCG tablet, Take 1 tablet by mouth daily. (Patient not taking: Reported on 10/03/2018), Disp: 1 Package, Rfl: 11  Allergies  Allergen Reactions  . Tamiflu [Oseltamivir Phosphate] Anaphylaxis    Swelling of throat, hands and itching, couldn't breathe   Past Medical History:  Diagnosis Date  . Asthma   . Depression   . GERD (gastroesophageal reflux disease)   . Lumbar herniated disc     Observations/Objective: A&O  No respiratory distress or wheezing audible over the phone Mood, judgement, and thought processes all WNL  Assessment and Plan: 1. COVID-19 virus detected - Hycodan sent for cough. Advised of sedation. Continue symptom management. Albuterol for wheezing/shortness of breath. Patient declined chest x-ray.  - HYDROcodone-homatropine (HYCODAN) 5-1.5 MG/5ML syrup; Take 5 mLs by mouth every 6 (six) hours as needed for cough.  Dispense: 120 mL; Refill: 0   Follow Up Instructions:  I discussed the assessment and treatment plan with the patient. The patient was provided an opportunity to ask questions and all were answered. The patient agreed with the plan and demonstrated an understanding of the instructions.   The patient was advised to call back or seek an in-person evaluation if the symptoms worsen or if the condition fails to improve as anticipated.  The above  assessment and management plan was discussed with the patient. The patient verbalized understanding of and has agreed to the management plan. Patient is aware to call the clinic if symptoms persist or worsen. Patient is aware when to return to the clinic for a follow-up visit. Patient educated on when it is appropriate to go to the emergency department.   Time call ended: 11:24 AM  I provided 9  minutes of non-face-to-face time during this encounter.  Hendricks Limes, MSN, APRN, FNP-C [facility] 12/06/18

## 2019-01-04 ENCOUNTER — Other Ambulatory Visit: Payer: Self-pay | Admitting: Physician Assistant

## 2019-01-04 DIAGNOSIS — Z3041 Encounter for surveillance of contraceptive pills: Secondary | ICD-10-CM

## 2019-01-04 MED ORDER — NORETHINDRONE-ETH ESTRADIOL 0.5-35 MG-MCG PO TABS: 1.0000 | ORAL_TABLET | Freq: Every day | ORAL | 1 refills | Status: DC

## 2019-01-04 NOTE — Telephone Encounter (Signed)
What is the name of the medication? Birth control  Have you contacted your pharmacy to request a refill? no  Which pharmacy would you like this sent to? [facility]   Patient notified that their request is being sent to the clinical staff for review and that they should receive a call once it is complete. If they do not receive a call within 24 hours they can check with their pharmacy or our office.

## 2019-01-04 NOTE — Telephone Encounter (Signed)
Pt last seen for well woman 09/2017 and has only been seen for COVID positive 11/6 and 11/10 by televisit. Ok to refill or does pt ntbs?

## 2019-01-04 NOTE — Telephone Encounter (Signed)
May refill 2 times and needs GYN exam

## 2019-01-04 NOTE — Telephone Encounter (Signed)
Patient aware.

## 2019-03-02 ENCOUNTER — Telehealth: Payer: Self-pay | Admitting: Physician Assistant

## 2019-03-02 DIAGNOSIS — Z3041 Encounter for surveillance of contraceptive pills: Secondary | ICD-10-CM

## 2019-03-02 MED ORDER — NORETHINDRONE-ETH ESTRADIOL 0.5-35 MG-MCG PO TABS: 1.0000 | ORAL_TABLET | Freq: Every day | ORAL | 0 refills | Status: DC

## 2019-03-02 NOTE — Telephone Encounter (Signed)
Med sent in.

## 2019-03-02 NOTE — Telephone Encounter (Signed)
What is the name of the medication? norethindrone-ethinyl estradiol (NECON) 0.5-35 MG-MCG tablet birth control  Have you contacted your pharmacy to request a refill? Yes needs apt. She has an apt for 03/06/2019 AJ 2:10. Pt says she will run out today.  Which pharmacy would you like this sent to? [facility] [place].   Patient notified that their request is being sent to the clinical staff for review and that they should receive a call once it is complete. If they do not receive a call within 24 hours they can check with their pharmacy or our office.

## 2019-03-06 ENCOUNTER — Encounter: Payer: Self-pay | Admitting: Physician Assistant

## 2019-03-06 ENCOUNTER — Ambulatory Visit (INDEPENDENT_AMBULATORY_CARE_PROVIDER_SITE_OTHER): Payer: 59 | Admitting: Physician Assistant

## 2019-03-06 ENCOUNTER — Other Ambulatory Visit: Payer: Self-pay

## 2019-03-06 VITALS — BP 125/88 | HR 98 | Temp 99.1°F | Ht 68.0 in | Wt 168.0 lb

## 2019-03-06 DIAGNOSIS — Z3041 Encounter for surveillance of contraceptive pills: Secondary | ICD-10-CM

## 2019-03-06 DIAGNOSIS — Z01419 Encounter for gynecological examination (general) (routine) without abnormal findings: Secondary | ICD-10-CM

## 2019-03-06 MED ORDER — NORETHINDRONE-ETH ESTRADIOL 0.5-35 MG-MCG PO TABS: 1.0000 | ORAL_TABLET | Freq: Every day | ORAL | 12 refills | Status: AC

## 2019-03-07 NOTE — Progress Notes (Signed)
BP 125/88   Pulse 98   Temp 99.1 F (37.3 C)   Ht '5\' 8"'$  (1.727 m)   Wt 168 lb (76.2 kg)   SpO2 100%   BMI 25.54 kg/m    Subjective:    Patient ID: Melanie Mcdaniel, female    DOB: 03-Apr-1995, 24 y.o.   MRN: [id number]  HPI 1. Encounter for surveillance of contraceptive pills  2. Well female exam with routine gynecological exam   HPI: Melanie Mcdaniel is a 24 y.o. female presenting on 03/06/2019 for Annual Exam (with pap)  This patient comes in for annual well physical examination. All medications are reviewed today. There are no reports of any problems with the medications. All of the medical conditions are reviewed and updated.  Lab work is reviewed and will be ordered as medically necessary. There are no new problems reported with today's visit.  Patient reports doing well overall.   Past Medical History:  Diagnosis Date  . Asthma   . Depression   . GERD (gastroesophageal reflux disease)   . Lumbar herniated disc    Relevant past medical, surgical, family and social history reviewed and updated as indicated. Interim medical history since our last visit reviewed. Allergies and medications reviewed and updated. DATA REVIEWED: CHART IN EPIC  Family History reviewed for pertinent findings.  Review of Systems  Constitutional: Negative.  Negative for activity change, fatigue and fever.  HENT: Negative.   Eyes: Negative.   Respiratory: Negative.  Negative for cough.   Cardiovascular: Negative.  Negative for chest pain.  Gastrointestinal: Negative.  Negative for abdominal pain.  Endocrine: Negative.   Genitourinary: Negative.  Negative for dysuria.  Musculoskeletal: Negative.   Skin: Negative.   Neurological: Negative.     Allergies as of 03/06/2019      Reactions   Tamiflu [oseltamivir Phosphate] Anaphylaxis   Swelling of throat, hands and itching, couldn't breathe      Medication List       Accurate as of March 06, 2019 11:59 PM. If you have any questions, ask your  nurse or doctor.        STOP taking these medications   benzonatate 200 MG capsule Commonly known as: TESSALON Stopped by: Terald Sleeper, PA-C   gabapentin 400 MG capsule Commonly known as: NEURONTIN Stopped by: Terald Sleeper, PA-C   HYDROcodone-homatropine 5-1.5 MG/5ML syrup Commonly known as: HYCODAN Stopped by: Terald Sleeper, PA-C   ibuprofen 800 MG tablet Commonly known as: ADVIL Stopped by: Terald Sleeper, PA-C     TAKE these medications   albuterol 108 (90 Base) MCG/ACT inhaler Commonly known as: VENTOLIN HFA Inhale 2 puffs into the lungs every 6 (six) hours as needed for wheezing or shortness of breath.   norethindrone-ethinyl estradiol 0.5-35 MG-MCG tablet Commonly known as: NECON Take 1 tablet by mouth daily.          Objective:    BP 125/88   Pulse 98   Temp 99.1 F (37.3 C)   Ht '5\' 8"'$  (1.727 m)   Wt 168 lb (76.2 kg)   SpO2 100%   BMI 25.54 kg/m   Allergies  Allergen Reactions  . Tamiflu [Oseltamivir Phosphate] Anaphylaxis    Swelling of throat, hands and itching, couldn't breathe    Wt Readings from Last 3 Encounters:  03/06/19 168 lb (76.2 kg)  10/03/18 175 lb (79.4 kg)  09/28/17 169 lb 3.2 oz (76.7 kg)    Physical Exam Constitutional:  Appearance: She is well-developed.  HENT:     Head: Normocephalic and atraumatic.  Eyes:     Conjunctiva/sclera: Conjunctivae normal.     Pupils: Pupils are equal, round, and reactive to light.  Cardiovascular:     Rate and Rhythm: Normal rate and regular rhythm.     Heart sounds: Normal heart sounds.  Pulmonary:     Effort: Pulmonary effort is normal.     Breath sounds: Normal breath sounds.  Chest:     Breasts: Breasts are symmetrical.        Right: No mass, skin change or tenderness.        Left: No mass, skin change or tenderness.  Abdominal:     General: Bowel sounds are normal.     Palpations: Abdomen is soft.  Genitourinary:    General: Normal vulva.     Exam position: Supine.      Pubic Area: No rash.      Labia:        Right: No tenderness or lesion.        Left: No tenderness or lesion.      Vagina: Normal. No vaginal discharge, tenderness or bleeding.     Cervix: No cervical motion tenderness, discharge or friability.     Uterus: Not deviated, not enlarged and not tender.      Adnexa:        Right: No mass, tenderness or fullness.         Left: No mass, tenderness or fullness.       Rectum: No anal fissure.  Musculoskeletal:     Cervical back: Normal range of motion and neck supple.  Skin:    General: Skin is warm and dry.     Findings: No rash.  Neurological:     Mental Status: She is alert and oriented to person, place, and time.     Deep Tendon Reflexes: Reflexes are normal and symmetric.  Psychiatric:        Behavior: Behavior normal.        Thought Content: Thought content normal.        Judgment: Judgment normal.     Results for orders placed or performed in visit on 09/28/17  CBC with Differential/Platelet  Result Value Ref Range   WBC 6.1 3.4 - 10.8 x10E3/uL   RBC 4.61 3.77 - 5.28 x10E6/uL   Hemoglobin 14.4 11.1 - 15.9 g/dL   Hematocrit 42.1 34.0 - 46.6 %   MCV 91 79 - 97 fL   MCH 31.2 26.6 - 33.0 pg   MCHC 34.2 31.5 - 35.7 g/dL   RDW 13.7 12.3 - 15.4 %   Platelets 246 150 - 450 x10E3/uL   Neutrophils 68 Not Estab. %   Lymphs 24 Not Estab. %   Monocytes 6 Not Estab. %   Eos 2 Not Estab. %   Basos 0 Not Estab. %   Neutrophils Absolute 4.1 1.4 - 7.0 x10E3/uL   Lymphocytes Absolute 1.5 0.7 - 3.1 x10E3/uL   Monocytes Absolute 0.4 0.1 - 0.9 x10E3/uL   EOS (ABSOLUTE) 0.1 0.0 - 0.4 x10E3/uL   Basophils Absolute 0.0 0.0 - 0.2 x10E3/uL   Immature Granulocytes 0 Not Estab. %   Immature Grans (Abs) 0.0 0.0 - 0.1 x10E3/uL  CMP14+EGFR  Result Value Ref Range   Glucose 97 65 - 99 mg/dL   BUN 9 6 - 20 mg/dL   Creatinine, Ser 0.99 0.57 - 1.00 mg/dL   GFR calc non Af Amer 81 >  59 mL/min/1.73   GFR calc Af Amer 94 >59 mL/min/1.73    BUN/Creatinine Ratio 9 9 - 23   Sodium 138 134 - 144 mmol/L   Potassium 4.4 3.5 - 5.2 mmol/L   Chloride 102 96 - 106 mmol/L   CO2 24 20 - 29 mmol/L   Calcium 9.8 8.7 - 10.2 mg/dL   Total Protein 6.4 6.0 - 8.5 g/dL   Albumin 4.3 3.5 - 5.5 g/dL   Globulin, Total 2.1 1.5 - 4.5 g/dL   Albumin/Globulin Ratio 2.0 1.2 - 2.2   Bilirubin Total 0.7 0.0 - 1.2 mg/dL   Alkaline Phosphatase 56 39 - 117 IU/L   AST 32 0 - 40 IU/L   ALT 30 0 - 32 IU/L  TSH  Result Value Ref Range   TSH 1.940 0.450 - 4.500 uIU/mL  Pap IG, CT/NG w/ reflex HPV when ASC-U  Result Value Ref Range   DIAGNOSIS: Comment    Specimen adequacy: Comment    Clinician Provided ICD10 Comment    Performed by: Comment    PAP Smear Comment .    Note: Comment    PAP Reflex Comment    Chlamydia, Nuc. Acid Amp Negative Negative   Gonococcus by Nucleic Acid Amp Negative Negative      Assessment & Plan:   1. Encounter for surveillance of contraceptive pills - norethindrone-ethinyl estradiol (NECON) 0.5-35 MG-MCG tablet; Take 1 tablet by mouth daily.  Dispense: 1 Package; Refill: 12  2. Well female exam with routine gynecological exam - Pap IG and Chlamydia/Gonococcus, NAA   Continue all other maintenance medications as listed above.  Follow up plan: Return in about 1 year (around 03/05/2020).  Educational handout given for health maintenance  Terald Sleeper PA-C [facility] [address]  [place], [place] [address] [phone]   03/07/2019, 5:52 PM

## 2019-03-08 LAB — PAP IG AND CT-NG NAA
Chlamydia, Nuc. Acid Amp: NEGATIVE
Gonococcus by Nucleic Acid Amp: NEGATIVE

## 2019-04-11 ENCOUNTER — Ambulatory Visit (INDEPENDENT_AMBULATORY_CARE_PROVIDER_SITE_OTHER): Payer: 59 | Admitting: Family Medicine

## 2019-04-11 ENCOUNTER — Encounter: Payer: Self-pay | Admitting: Family Medicine

## 2019-04-11 DIAGNOSIS — N926 Irregular menstruation, unspecified: Secondary | ICD-10-CM

## 2019-04-11 NOTE — Progress Notes (Signed)
Virtual Visit via telephone Note Due to COVID-19 pandemic this visit was conducted virtually. This visit type was conducted due to national recommendations for restrictions regarding the COVID-19 Pandemic (e.g. social distancing, sheltering in place) in an effort to limit this patient's exposure and mitigate transmission in our community. All issues noted in this document were discussed and addressed.  A physical exam was not performed with this format.   I connected with Melanie Mcdaniel on 04/11/2019 at 1410 by telephone and verified that I am speaking with the correct person using two identifiers. Melanie Mcdaniel is currently located at home and no one is currently with them during visit. The provider, Kari Baars, FNP is located in their office at time of visit.  I discussed the limitations, risks, security and privacy concerns of performing an evaluation and management service by telephone and the availability of in person appointments. I also discussed with the patient that there may be a patient responsible charge related to this service. The patient expressed understanding and agreed to proceed.  Subjective:  Patient ID: Melanie Mcdaniel, female    DOB: 12-03-1995, 24 y.o.   MRN: [id number]  Chief Complaint:  Vaginal Bleeding   HPI: Melanie Mcdaniel is a 24 y.o. female presenting on 04/11/2019 for Vaginal Bleeding   Pt states she was started on a new OCP in late December. Pt states she had her normal cycle ended on 04/03/2019 and she started bleeding again on 04/07/2019. States she is using 1 tampon every 2 hours or so. She denies clotting. No recent unprotected coitus. Does have fatigue. No chest pain, shortness of breath, dizziness, palpitations, or syncope. States she was concerned due to having another cycle so soon.     Relevant past medical, surgical, family, and social history reviewed and updated as indicated.  Allergies and medications reviewed and updated.   Past Medical History:   Diagnosis Date  . Asthma   . Depression   . GERD (gastroesophageal reflux disease)   . Lumbar herniated disc     Past Surgical History:  Procedure Laterality Date  . ADENOIDECTOMY    . LUMBAR LAMINECTOMY Right 04/23/2014   Procedure: Right L5-S1 Microdiscectomy;  Surgeon: Eldred Manges, MD;  Location: [facility] OR;  Service: Orthopedics;  Laterality: Right;  . TONSILLECTOMY    . 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 Marital status: Single    Spouse name: Not on file  . Number of children: Not on file  . Years of education: Not on file  . Highest education level: Not on file  Occupational History  . Not on file  Tobacco Use  . Smoking status: Passive Smoke Exposure - Never Smoker  . Smokeless tobacco: Never Used  Substance and Sexual Activity  . Alcohol use: Yes    Comment: occ  . Drug use: No  . Sexual activity: Not on file  Other Topics Concern  . Not on file  Social History Narrative  . Not on file   Social Determinants of Health   Financial Resource Strain:   . Difficulty of Paying Living Expenses:   Food Insecurity:   . Worried About Programme researcher, broadcasting/film/video in the Last Year:   . Barista in the Last Year:   Transportation Needs:   . Freight forwarder (Medical):   Marland Kitchen Lack of Transportation (Non-Medical):   Physical Activity:   . Days of Exercise per  Week:   . Minutes of Exercise per Session:   Stress:   . Feeling of Stress :   Social Connections:   . Frequency of Communication with Friends and Family:   . Frequency of Social Gatherings with Friends and Family:   . Attends Religious Services:   . Active Member of Clubs or Organizations:   . Attends Banker Meetings:   Marland Kitchen Marital Status:   Intimate Partner Violence:   . Fear of Current or Ex-Partner:   . Emotionally Abused:   Marland Kitchen Physically Abused:   . Sexually Abused:     Outpatient Encounter Medications as of 04/11/2019   Medication Sig  . albuterol (VENTOLIN HFA) 108 (90 Base) MCG/ACT inhaler Inhale 2 puffs into the lungs every 6 (six) hours as needed for wheezing or shortness of breath.  . norethindrone-ethinyl estradiol (NECON) 0.5-35 MG-MCG tablet Take 1 tablet by mouth daily.  . [DISCONTINUED] norethindrone-ethinyl estradiol (JUNEL FE,GILDESS FE,LOESTRIN FE) 1-20 MG-MCG tablet Take 1 tablet by mouth daily.   No facility-administered encounter medications on file as of 04/11/2019.    Allergies  Allergen Reactions  . Tamiflu [Oseltamivir Phosphate] Anaphylaxis    Swelling of throat, hands and itching, couldn't breathe    Review of Systems  Constitutional: Positive for fatigue. Negative for activity change, appetite change, chills, diaphoresis, fever and unexpected weight change.  HENT: Negative.   Eyes: Negative.  Negative for photophobia and visual disturbance.  Respiratory: Negative for cough, chest tightness, shortness of breath and wheezing.   Cardiovascular: Negative for chest pain, palpitations and leg swelling.  Gastrointestinal: Negative for abdominal pain, blood in stool, constipation, diarrhea, nausea and vomiting.  Endocrine: Negative.   Genitourinary: Positive for menstrual problem and vaginal bleeding. Negative for decreased urine volume, difficulty urinating, dyspareunia, dysuria, enuresis, flank pain, frequency, genital sores, hematuria, pelvic pain, urgency, vaginal discharge and vaginal pain.  Musculoskeletal: Negative for arthralgias and myalgias.  Skin: Negative.   Allergic/Immunologic: Negative.   Neurological: Negative for dizziness, tremors, seizures, syncope, facial asymmetry, speech difficulty, weakness, light-headedness, numbness and headaches.  Hematological: Negative.   Psychiatric/Behavioral: Negative for confusion, hallucinations, sleep disturbance and suicidal ideas.  All other systems reviewed and are negative.        Observations/Objective: No vital signs or  physical exam, this was a telephone or virtual health encounter.  Pt alert and oriented, answers all questions appropriately, and able to speak in full sentences.    Assessment and Plan: Melanie Mcdaniel was seen today for vaginal bleeding.  Diagnoses and all orders for this visit:  Abnormal menses Pt educated on OCP an need for adjusting after OCP changes made. Pt aware to report any new, worsening, or continued bleeding. Pt to have CBC completed. Follow up if symptoms persist.  -     CBC with Differential/Platelet     Follow Up Instructions: Return if symptoms worsen or fail to improve.    I discussed the assessment and treatment plan with the patient. The patient was provided an opportunity to ask questions and all were answered. The patient agreed with the plan and demonstrated an understanding of the instructions.   The patient was advised to call back or seek an in-person evaluation if the symptoms worsen or if the condition fails to improve as anticipated.  The above assessment and management plan was discussed with the patient. The patient verbalized understanding of and has agreed to the management plan. Patient is aware to call the clinic if they develop any new symptoms or if  symptoms persist or worsen. Patient is aware when to return to the clinic for a follow-up visit. Patient educated on when it is appropriate to go to the emergency department.    I provided 15 minutes of non-face-to-face time during this encounter. The call started at 1410. The call ended at 1425. The other time was used for coordination of care.    Monia Pouch, FNP-C [facility] [address] [place], [place] [id number] [phone] 04/11/2019

## 2019-05-12 ENCOUNTER — Ambulatory Visit: Payer: 59 | Admitting: Nurse Practitioner

## 2019-05-18 ENCOUNTER — Encounter: Payer: Self-pay | Admitting: Physician Assistant

## 2019-07-19 ENCOUNTER — Ambulatory Visit (INDEPENDENT_AMBULATORY_CARE_PROVIDER_SITE_OTHER): Payer: 59

## 2019-07-19 ENCOUNTER — Other Ambulatory Visit: Payer: Self-pay

## 2019-07-19 ENCOUNTER — Encounter: Payer: Self-pay | Admitting: Emergency Medicine

## 2019-07-19 ENCOUNTER — Ambulatory Visit
Admission: EM | Admit: 2019-07-19 | Discharge: 2019-07-19 | Disposition: A | Discharge status: Home / Self Care | Payer: 59 | Attending: Emergency Medicine | Admitting: Emergency Medicine

## 2019-07-19 DIAGNOSIS — F419 Anxiety disorder, unspecified: Secondary | ICD-10-CM | POA: Diagnosis not present

## 2019-07-19 DIAGNOSIS — R0602 Shortness of breath: Secondary | ICD-10-CM | POA: Diagnosis not present

## 2019-07-19 DIAGNOSIS — R0789 Other chest pain: Secondary | ICD-10-CM

## 2019-07-19 DIAGNOSIS — R079 Chest pain, unspecified: Secondary | ICD-10-CM

## 2019-07-19 MED ORDER — HYDROXYZINE HCL 25 MG PO TABS: 25.0000 mg | ORAL_TABLET | Freq: Three times a day (TID) | ORAL | 0 refills | Status: AC | PRN

## 2019-07-19 NOTE — ED Provider Notes (Addendum)
[facility]   [id number] 07/19/19 Arrival Time: 1436  Chief Complaint  Patient presents with  . Shortness of Breath     SUBJECTIVE:  Melanie Mcdaniel is a 24 y.o. female who presents to the urgent care for complaint of shortness of breath, headache and chest pain on inspiration for the past 2 days.  Denies a precipitating event, trauma, recent lower respiratory tract, or strenuous upper body activities.  Localizes chest pain to the substernal region.  Describes as achy and and constant on inspiration.  Rates pain at 6 on a scale of 1-10.  Has not tried any OTC medication for relief.  Symptoms made worse with inspiration.  Mother also states that she has been having issues with anxiety attack.  Denies radiates symptoms.  Denies previous symptoms in the past.  Denies fever, chills, lightheadedness, dizziness, palpitations, tachycardia, nausea, vomiting, abdominal pain, changes in bowel or bladder habits, diaphoresis, numbness/tingling in extremities, peripheral edema, or anxiety.     Denies close relatives with cardiac hx  Previous cardiac testing: none.  ROS: As per HPI.  All other pertinent ROS negative.    Past Medical History:  Diagnosis Date  . Asthma   . Depression   . GERD (gastroesophageal reflux disease)   . Lumbar herniated disc    Past Surgical History:  Procedure Laterality Date  . ADENOIDECTOMY    . LUMBAR LAMINECTOMY Right 04/23/2014   Procedure: Right L5-S1 Microdiscectomy;  Surgeon: Eldred Manges, MD;  Location: [facility] OR;  Service: Orthopedics;  Laterality: Right;  . TONSILLECTOMY    . TUMOR EXCISION Right age 34   around carotid artery   Allergies  Allergen Reactions  . Tamiflu [Oseltamivir Phosphate] Anaphylaxis    Swelling of throat, hands and itching, couldn't breathe   No current facility-administered medications on file prior to encounter.   Current Outpatient Medications on File Prior to Encounter  Medication Sig Dispense Refill  . albuterol  (VENTOLIN HFA) 108 (90 Base) MCG/ACT inhaler Inhale 2 puffs into the lungs every 6 (six) hours as needed for wheezing or shortness of breath. 8 g 0  . norethindrone-ethinyl estradiol (NECON) 0.5-35 MG-MCG tablet Take 1 tablet by mouth daily. 1 Package 12  . [DISCONTINUED] norethindrone-ethinyl estradiol (JUNEL FE,GILDESS FE,LOESTRIN FE) 1-20 MG-MCG tablet Take 1 tablet by mouth daily. 1 Package 12   Social History   Socioeconomic History  . Marital status: Single    Spouse name: Not on file  . Number of children: Not on file  . Years of education: Not on file  . Highest education level: Not on file  Occupational History  . Not on file  Tobacco Use  . Smoking status: Passive Smoke Exposure - Never Smoker  . Smokeless tobacco: Never Used  Vaping Use  . Vaping Use: Every day  Substance and Sexual Activity  . Alcohol use: Yes    Comment: occ  . Drug use: No  . Sexual activity: Not on file  Other Topics Concern  . Not on file  Social History Narrative  . Not on file   Social Determinants of Health   Financial Resource Strain:   . Difficulty of Paying Living Expenses:   Food Insecurity:   . Worried About Programme researcher, broadcasting/film/video in the Last Year:   . Barista in the Last Year:   Transportation Needs:   . Freight forwarder (Medical):   Marland Kitchen Lack of Transportation (Non-Medical):   Physical Activity:   . Days of Exercise  per Week:   . Minutes of Exercise per Session:   Stress:   . Feeling of Stress :   Social Connections:   . Frequency of Communication with Friends and Family:   . Frequency of Social Gatherings with Friends and Family:   . Attends Religious Services:   . Active Member of Clubs or Organizations:   . Attends Archivist Meetings:   Marland Kitchen Marital Status:   Intimate Partner Violence:   . Fear of Current or Ex-Partner:   . Emotionally Abused:   Marland Kitchen Physically Abused:   . Sexually Abused:    Family History  Problem Relation Age of Onset  . Alcohol  abuse Father      OBJECTIVE:  Vitals:   07/19/19 1540  BP: (!) 139/105    General appearance: alert; no distress Eyes: PERRLA; EOMI; conjunctiva normal HENT: normocephalic; atraumatic Neck: supple Lungs: clear to auscultation bilaterally without adventitious breath sounds Heart: regular rate and rhythm.  Clear S1 and S2 without rubs, gallops, or murmur. Chest Wall:  no thrills Abdomen: soft, non-tender; bowel sounds normal; no masses or organomegaly; no guarding or rebound tenderness Extremities: no cyanosis or edema; symmetrical with no gross deformities Skin: warm and dry Psychological: alert and cooperative; normal mood and affect  ECG: Orders placed or performed during the hospital encounter of 07/19/19  . ED EKG  . ED EKG    EKG normal sinus rhythm with left posterior fascicular block, without ST elevations, depressions, or prolonged PR interval.  No narrowing or widening of the QRS complexes.    Reviewed  EKG was reviewed with Dr. Lynelle Doctor  LABS:  Results for orders placed or performed in visit on 03/06/19  Pap IG and Chlamydia/Gonococcus, NAA  Result Value Ref Range   Interpretation NILM    Category NIL    [facility]    Clinician Provided ICD10 Comment    Performed by: Comment    Note: Comment    Test Methodology Comment    Chlamydia, Nuc. Acid Amp Negative Negative   Gonococcus by Nucleic Acid Amp Negative Negative   Labs Reviewed - No data to display  DIAGNOSTIC STUDIES:  DG Chest 2 View  Result Date: 07/19/2019 CLINICAL DATA:  24 year old female with shortness of breath and chest pain. EXAM: CHEST - 2 VIEW COMPARISON:  Chest radiograph dated 04/13/2014. FINDINGS: The heart size and mediastinal contours are within normal limits. Both lungs are clear. The visualized skeletal structures are unremarkable. IMPRESSION: No active cardiopulmonary disease. Electronically Signed   By: Anner Crete M.D.   On: 07/19/2019 15:35     ASSESSMENT & PLAN:  1.  Other chest pain   2. Anxiety     Meds ordered this encounter  Medications  . hydrOXYzine (ATARAX/VISTARIL) 25 MG tablet    Sig: Take 1 tablet (25 mg total) by mouth 3 (three) times daily as needed.    Dispense:  30 tablet    Refill:  0    Patient history and exam consistent with non-cardiac cause of chest pain. Worsening signs and symptoms discussed and patient verbalized understanding.  Patient was advised to go to ED for further evaluation.  She declined this time and states she will go she developed worsening symptoms.   Discharge instruction Chest pain precautions given. Follow-up with PCP for evaluation  Go to ED for worsening of symptoms  reviewed expectations re: course of current medical issues. Questions answered. Outlined signs and symptoms indicating need for more acute intervention. Patient verbalized understanding.  After Visit Summary given.    Note: This document was prepared using Dragon voice recognition software and may include unintentional dictation errors.     Durward Parcel, FNP 07/19/19 1557    Durward Parcel, FNP 07/19/19 1558

## 2019-07-19 NOTE — ED Triage Notes (Signed)
Pt presents with c/o head ache and some pain with inspiration, bp initially 150/110, second check is 155/122

## 2019-07-19 NOTE — Discharge Instructions (Addendum)
Chest pain precautions given. Rest and drink fluids  Follow-up with PCP for evaluation  Go to ED for worsening of symptoms

## 2021-09-16 DIAGNOSIS — Z0000 Encounter for general adult medical examination without abnormal findings: Secondary | ICD-10-CM | POA: Diagnosis not present

## 2021-09-30 DIAGNOSIS — R52 Pain, unspecified: Secondary | ICD-10-CM | POA: Diagnosis not present

## 2021-09-30 DIAGNOSIS — J029 Acute pharyngitis, unspecified: Secondary | ICD-10-CM | POA: Diagnosis not present

## 2021-09-30 DIAGNOSIS — R059 Cough, unspecified: Secondary | ICD-10-CM | POA: Diagnosis not present
